# Patient Record
Sex: Female | Born: 1995 | Race: White | Hispanic: No | Marital: Single | Smoking: Former smoker
Health system: Southern US, Community
[De-identification: ages and names within clinical notes are randomized; demographics above are authoritative.]

## PROBLEM LIST (undated history)

## (undated) ENCOUNTER — Inpatient Hospital Stay (HOSPITAL_COMMUNITY): Payer: Self-pay

## (undated) DIAGNOSIS — O321XX1 Maternal care for breech presentation, fetus 1: Secondary | ICD-10-CM

## (undated) DIAGNOSIS — Z98891 History of uterine scar from previous surgery: Secondary | ICD-10-CM

## (undated) DIAGNOSIS — A749 Chlamydial infection, unspecified: Secondary | ICD-10-CM

## (undated) DIAGNOSIS — Z302 Encounter for sterilization: Secondary | ICD-10-CM

## (undated) DIAGNOSIS — Z8619 Personal history of other infectious and parasitic diseases: Secondary | ICD-10-CM

## (undated) DIAGNOSIS — R87629 Unspecified abnormal cytological findings in specimens from vagina: Secondary | ICD-10-CM

## (undated) HISTORY — PX: EYE SURGERY: SHX253

## (undated) HISTORY — PX: OTHER SURGICAL HISTORY: SHX169

---
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
## 2014-03-06 ENCOUNTER — Encounter (HOSPITAL_COMMUNITY): Payer: Self-pay

## 2014-09-24 ENCOUNTER — Encounter (HOSPITAL_COMMUNITY): Payer: Self-pay | Admitting: *Deleted

## 2014-09-24 ENCOUNTER — Emergency Department (HOSPITAL_COMMUNITY)
Admission: EM | Admit: 2014-09-24 | Discharge: 2014-09-24 | Disposition: A | Discharge status: Home / Self Care | Payer: 59 | Attending: Emergency Medicine | Admitting: Emergency Medicine

## 2014-09-24 DIAGNOSIS — Z3202 Encounter for pregnancy test, result negative: Secondary | ICD-10-CM | POA: Insufficient documentation

## 2014-09-24 DIAGNOSIS — Z79899 Other long term (current) drug therapy: Secondary | ICD-10-CM | POA: Diagnosis not present

## 2014-09-24 DIAGNOSIS — Z8619 Personal history of other infectious and parasitic diseases: Secondary | ICD-10-CM | POA: Diagnosis not present

## 2014-09-24 DIAGNOSIS — N730 Acute parametritis and pelvic cellulitis: Secondary | ICD-10-CM | POA: Insufficient documentation

## 2014-09-24 DIAGNOSIS — Z202 Contact with and (suspected) exposure to infections with a predominantly sexual mode of transmission: Secondary | ICD-10-CM | POA: Diagnosis present

## 2014-09-24 LAB — URINALYSIS, ROUTINE W REFLEX MICROSCOPIC
Bilirubin Urine: NEGATIVE
Glucose, UA: NEGATIVE mg/dL
Ketones, ur: NEGATIVE mg/dL
Nitrite: POSITIVE — AB
Protein, ur: NEGATIVE mg/dL
Specific Gravity, Urine: 1.025 (ref 1.005–1.030)
UROBILINOGEN UA: 0.2 mg/dL (ref 0.0–1.0)
pH: 6.5 (ref 5.0–8.0)

## 2014-09-24 LAB — URINE MICROSCOPIC-ADD ON

## 2014-09-24 LAB — PREGNANCY, URINE: Preg Test, Ur: NEGATIVE

## 2014-09-24 MED ORDER — SODIUM CHLORIDE 0.9 % IJ SOLN: 3.0000 mL | Freq: Once | INTRAMUSCULAR | Status: DC

## 2014-09-24 MED ORDER — METRONIDAZOLE 500 MG PO TABS
500.0000 mg | ORAL_TABLET | Freq: Once | ORAL | Status: AC
  Administered 2014-09-24: 500 mg via ORAL
  Filled 2014-09-24: qty 1

## 2014-09-24 MED ORDER — DOXYCYCLINE HYCLATE 100 MG PO TABS
100.0000 mg | ORAL_TABLET | Freq: Once | ORAL | Status: AC
  Administered 2014-09-24: 100 mg via ORAL
  Filled 2014-09-24: qty 1

## 2014-09-24 MED ORDER — CEFTRIAXONE SODIUM 250 MG IJ SOLR
250.0000 mg | Freq: Once | INTRAMUSCULAR | Status: AC
  Filled 2014-09-24: qty 250

## 2014-09-24 MED ORDER — CEFTRIAXONE SODIUM 250 MG IJ SOLR
250.0000 mg | INTRAMUSCULAR | Status: DC
  Administered 2014-09-24: 250 mg via INTRAMUSCULAR
  Filled 2014-09-24: qty 250

## 2014-09-24 MED ORDER — METRONIDAZOLE 500 MG PO TABS: 500.0000 mg | ORAL_TABLET | Freq: Two times a day (BID) | ORAL | Status: DC

## 2014-09-24 MED ORDER — DOXYCYCLINE HYCLATE 100 MG PO CAPS: 100.0000 mg | ORAL_CAPSULE | Freq: Two times a day (BID) | ORAL | Status: DC

## 2014-09-24 MED ORDER — LIDOCAINE HCL (PF) 1 % IJ SOLN
INTRAMUSCULAR | Status: AC
Start: 2014-09-24 — End: 2014-09-24
  Administered 2014-09-24: 5 mL
  Filled 2014-09-24: qty 5

## 2014-09-24 NOTE — Discharge Instructions (Signed)
Pelvic Inflammatory Disease °Pelvic inflammatory disease (PID) refers to an infection in some or all of the female organs. The infection can be in the uterus, ovaries, fallopian tubes, or the surrounding tissues in the pelvis. PID can cause abdominal or pelvic pain that comes on suddenly (acute pelvic pain). PID is a serious infection because it can lead to lasting (chronic) pelvic pain or the inability to have children (infertile).  °CAUSES  °The infection is often caused by the normal bacteria found in the vaginal tissues. PID may also be caused by an infection that is spread during sexual contact. PID can also occur following:  °· The birth of a baby.   °· A miscarriage.   °· An abortion.   °· Major pelvic surgery.   °· The use of an intrauterine device (IUD).   °· A sexual assault.   °RISK FACTORS °Certain factors can put a person at higher risk for PID, such as: °· Being younger than 25 years. °· Being sexually active at a young age. °· Using nonbarrier contraception. °· Having multiple sexual partners. °· Having sex with someone who has symptoms of a genital infection. °· Using oral contraception. °Other times, certain behaviors can increase the possibility of getting PID, such as: °· Having sex during your period. °· Using a vaginal douche. °· Having an intrauterine device (IUD) in place. °SYMPTOMS  °· Abdominal or pelvic pain.   °· Fever.   °· Chills.   °· Abnormal vaginal discharge. °· Abnormal uterine bleeding.   °· Unusual pain shortly after finishing your period. °DIAGNOSIS  °Your caregiver will choose some of the following methods to make a diagnosis, such as:  °· Performing a physical exam and history. A pelvic exam typically reveals a very tender uterus and surrounding pelvis.   °· Ordering laboratory tests including a pregnancy test, blood tests, and urine test.  °· Ordering cultures of the vagina and cervix to check for a sexually transmitted infection (STI). °· Performing an ultrasound.    °· Performing a laparoscopic procedure to look inside the pelvis.   °TREATMENT  °· Antibiotic medicines may be prescribed and taken by mouth.   °· Sexual partners may be treated when the infection is caused by a sexually transmitted disease (STD).   °· Hospitalization may be needed to give antibiotics intravenously. °· Surgery may be needed, but this is rare. °It may take weeks until you are completely well. If you are diagnosed with PID, you should also be checked for human immunodeficiency virus (HIV).   °HOME CARE INSTRUCTIONS  °· If given, take your antibiotics as directed. Finish the medicine even if you start to feel better.   °· Only take over-the-counter or prescription medicines for pain, discomfort, or fever as directed by your caregiver.   °· Do not have sexual intercourse until treatment is completed or as directed by your caregiver. If PID is confirmed, your recent sexual partner(s) will need treatment.   °· Keep your follow-up appointments. °SEEK MEDICAL CARE IF:  °· You have increased or abnormal vaginal discharge.   °· You need prescription medicine for your pain.   °· You vomit.   °· You cannot take your medicines.   °· Your partner has an STD.   °SEEK IMMEDIATE MEDICAL CARE IF:  °· You have a fever.   °· You have increased abdominal or pelvic pain.   °· You have chills.   °· You have pain when you urinate.   °· You are not better after 72 hours following treatment.   °MAKE SURE YOU:  °· Understand these instructions. °· Will watch your condition. °· Will get help right away if you are not doing well or get worse. °  Document Released: 04/21/2005 Document Revised: 08/16/2012 Document Reviewed: 04/17/2011 [facility] Patient Information 2015 [place], [place]. This information is not intended to replace advice given to you by your health care provider. Make sure you discuss any questions you have with your health care provider.  Sexually Transmitted Disease A sexually transmitted disease (STD) is a  disease or infection that may be passed (transmitted) from person to person, usually during sexual activity. This may happen by way of saliva, semen, blood, vaginal mucus, or urine. Common STDs include:   Gonorrhea.   Chlamydia.   Syphilis.   HIV and AIDS.   Genital herpes.   Hepatitis B and C.   Trichomonas.   Human papillomavirus (HPV).   Pubic lice.   Scabies.  Mites.  Bacterial vaginosis. WHAT ARE CAUSES OF STDs? An STD may be caused by bacteria, a virus, or parasites. STDs are often transmitted during sexual activity if one person is infected. However, they may also be transmitted through nonsexual means. STDs may be transmitted after:   Sexual intercourse with an infected person.   Sharing sex toys with an infected person.   Sharing needles with an infected person or using unclean piercing or tattoo needles.  Having intimate contact with the genitals, mouth, or rectal areas of an infected person.   Exposure to infected fluids during birth. WHAT ARE THE SIGNS AND SYMPTOMS OF STDs? Different STDs have different symptoms. Some people may not have any symptoms. If symptoms are present, they may include:   Painful or bloody urination.   Pain in the pelvis, abdomen, vagina, anus, throat, or eyes.   A skin rash, itching, or irritation.  Growths, ulcerations, blisters, or sores in the genital and anal areas.  Abnormal vaginal discharge with or without bad odor.   Penile discharge in men.   Fever.   Pain or bleeding during sexual intercourse.   Swollen glands in the groin area.   Yellow skin and eyes (jaundice). This is seen with hepatitis.   Swollen testicles.  Infertility.  Sores and blisters in the mouth. HOW ARE STDs DIAGNOSED? To make a diagnosis, your health care provider may:   Take a medical history.   Perform a physical exam.   Take a sample of any discharge to examine.  Swab the throat, cervix, opening to the  penis, rectum, or vagina for testing.  Test a sample of your first morning urine.   Perform blood tests.   Perform a Pap test, if this applies.   Perform a colposcopy.   Perform a laparoscopy.  HOW ARE STDs TREATED? Treatment depends on the STD. Some STDs may be treated but not cured.   Chlamydia, gonorrhea, trichomonas, and syphilis can be cured with antibiotic medicine.   Genital herpes, hepatitis, and HIV can be treated, but not cured, with prescribed medicines. The medicines lessen symptoms.   Genital warts from HPV can be treated with medicine or by freezing, burning (electrocautery), or surgery. Warts may come back.   HPV cannot be cured with medicine or surgery. However, abnormal areas may be removed from the cervix, vagina, or vulva.   If your diagnosis is confirmed, your recent sexual partners need treatment. This is true even if they are symptom-free or have a negative culture or evaluation. They should not have sex until their health care providers say it is okay. HOW CAN I REDUCE MY RISK OF GETTING AN STD? Take these steps to reduce your risk of getting an STD:  Use latex condoms, dental dams,  and water-soluble lubricants during sexual activity. Do not use petroleum jelly or oils.  Avoid having multiple sex partners.  Do not have sex with someone who has other sex partners.  Do not have sex with anyone you do not know or who is at high risk for an STD.  Avoid risky sex practices that can break your skin.  Do not have sex if you have open sores on your mouth or skin.  Avoid drinking too much alcohol or taking illegal drugs. Alcohol and drugs can affect your judgment and put you in a vulnerable position.  Avoid engaging in oral and anal sex acts.  Get vaccinated for HPV and hepatitis. If you have not received these vaccines in the past, talk to your health care provider about whether one or both might be right for you.   If you are at risk of being  infected with HIV, it is recommended that you take a prescription medicine daily to prevent HIV infection. This is called pre-exposure prophylaxis (PrEP). You are considered at risk if:  You are a man who has sex with other men (MSM).  You are a heterosexual man or woman and are sexually active with more than one partner.  You take drugs by injection.  You are sexually active with a partner who has HIV.  Talk with your health care provider about whether you are at high risk of being infected with HIV. If you choose to begin PrEP, you should first be tested for HIV. You should then be tested every 3 months for as long as you are taking PrEP.  WHAT SHOULD I DO IF I THINK I HAVE AN STD?  See your health care provider.   Tell your sexual partner(s). They should be tested and treated for any STDs.  Do not have sex until your health care provider says it is okay. WHEN SHOULD I GET IMMEDIATE MEDICAL CARE? Contact your health care provider right away if:   You have severe abdominal pain.  You are a man and notice swelling or pain in your testicles.  You are a woman and notice swelling or pain in your vagina. Document Released: 07/12/2002 Document Revised: 04/26/2013 Document Reviewed: 11/09/2012 [facility] Patient Information 2015 [place]ExitCare, [place]LL[place]. This information is not intended to replace advice given to you by your health care provider. Make sure you discuss any questions you have with your health care provider.

## 2014-09-24 NOTE — ED Notes (Signed)
Pt reports exposure to STD. Reports whitish discharge.
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
## 2014-09-24 NOTE — ED Notes (Signed)
Pelvic supplies at bedside. 

## 2014-09-25 LAB — GC/CHLAMYDIA PROBE AMP (~~LOC~~) NOT AT ARMC
Chlamydia: POSITIVE — AB
Neisseria Gonorrhea: NEGATIVE

## 2014-09-25 LAB — RPR: RPR: NONREACTIVE

## 2014-09-25 LAB — HIV ANTIBODY (ROUTINE TESTING W REFLEX): HIV Screen 4th Generation wRfx: NONREACTIVE

## 2014-09-27 ENCOUNTER — Telehealth (HOSPITAL_COMMUNITY): Payer: Self-pay

## 2014-09-27 NOTE — ED Notes (Addendum)
Positive for chlamydia. Treated per protocol. Advised to notify partner(s) and abstain from sexual activity x 10 days. Pt is following up with OB/GYN. DHHS form completed and faxed

## 2015-09-14 ENCOUNTER — Encounter (HOSPITAL_COMMUNITY): Payer: Self-pay | Admitting: *Deleted

## 2015-09-14 ENCOUNTER — Inpatient Hospital Stay (HOSPITAL_COMMUNITY)
Admission: AD | Admit: 2015-09-14 | Discharge: 2015-09-14 | Disposition: A | Discharge status: Home / Self Care | Payer: BLUE CROSS/BLUE SHIELD | Source: Ambulatory Visit | Attending: Obstetrics & Gynecology | Admitting: Obstetrics & Gynecology

## 2015-09-14 ENCOUNTER — Inpatient Hospital Stay (HOSPITAL_COMMUNITY): Payer: BLUE CROSS/BLUE SHIELD

## 2015-09-14 DIAGNOSIS — O3680X0 Pregnancy with inconclusive fetal viability, not applicable or unspecified: Secondary | ICD-10-CM

## 2015-09-14 DIAGNOSIS — Z3A01 Less than 8 weeks gestation of pregnancy: Secondary | ICD-10-CM | POA: Insufficient documentation

## 2015-09-14 DIAGNOSIS — O26899 Other specified pregnancy related conditions, unspecified trimester: Secondary | ICD-10-CM

## 2015-09-14 DIAGNOSIS — B9689 Other specified bacterial agents as the cause of diseases classified elsewhere: Secondary | ICD-10-CM | POA: Insufficient documentation

## 2015-09-14 DIAGNOSIS — R109 Unspecified abdominal pain: Secondary | ICD-10-CM

## 2015-09-14 DIAGNOSIS — O23591 Infection of other part of genital tract in pregnancy, first trimester: Secondary | ICD-10-CM | POA: Insufficient documentation

## 2015-09-14 DIAGNOSIS — N760 Acute vaginitis: Secondary | ICD-10-CM | POA: Diagnosis not present

## 2015-09-14 LAB — POCT PREGNANCY, URINE: PREG TEST UR: POSITIVE — AB

## 2015-09-14 LAB — CBC WITH DIFFERENTIAL/PLATELET
BASOS ABS: 0 10*3/uL (ref 0.0–0.1)
Basophils Relative: 0 %
Eosinophils Absolute: 0.2 10*3/uL (ref 0.0–0.7)
Eosinophils Relative: 2 %
HEMATOCRIT: 33.6 % — AB (ref 36.0–46.0)
HEMOGLOBIN: 11.5 g/dL — AB (ref 12.0–15.0)
Lymphocytes Relative: 37 %
Lymphs Abs: 3.1 10*3/uL (ref 0.7–4.0)
MCH: 28.9 pg (ref 26.0–34.0)
MCHC: 34.2 g/dL (ref 30.0–36.0)
MCV: 84.4 fL (ref 78.0–100.0)
Monocytes Absolute: 0.5 10*3/uL (ref 0.1–1.0)
Monocytes Relative: 6 %
NEUTROS ABS: 4.5 10*3/uL (ref 1.7–7.7)
NEUTROS PCT: 55 %
Platelets: 253 10*3/uL (ref 150–400)
RBC: 3.98 MIL/uL (ref 3.87–5.11)
RDW: 13.3 % (ref 11.5–15.5)
WBC: 8.2 10*3/uL (ref 4.0–10.5)

## 2015-09-14 LAB — URINALYSIS, ROUTINE W REFLEX MICROSCOPIC
Bilirubin Urine: NEGATIVE
GLUCOSE, UA: NEGATIVE mg/dL
Hgb urine dipstick: NEGATIVE
Ketones, ur: NEGATIVE mg/dL
Nitrite: NEGATIVE
Protein, ur: NEGATIVE mg/dL
pH: 6 (ref 5.0–8.0)

## 2015-09-14 LAB — URINE MICROSCOPIC-ADD ON: RBC / HPF: NONE SEEN RBC/hpf (ref 0–5)

## 2015-09-14 LAB — WET PREP, GENITAL
TRICH WET PREP: NONE SEEN
YEAST WET PREP: NONE SEEN

## 2015-09-14 LAB — HCG, QUANTITATIVE, PREGNANCY: hCG, Beta Chain, Quant, S: 3543 m[IU]/mL — ABNORMAL HIGH (ref <5)

## 2015-09-14 MED ORDER — METRONIDAZOLE 500 MG PO TABS: 500.0000 mg | ORAL_TABLET | Freq: Two times a day (BID) | ORAL | Status: DC

## 2015-09-14 NOTE — Discharge Instructions (Signed)

## 2015-09-14 NOTE — MAU Note (Signed)
Found out i was pregnant recently. Mild cramping today. Denies bleeding. Some clear d/c. LMP 07/23/15

## 2015-09-14 NOTE — Progress Notes (Signed)
Vonzella NippleJulie Wenzel PA in to see pt to discuss test results and d/c plan. Written and verbal d/c instructions given and understanding voiced

## 2015-09-14 NOTE — MAU Provider Note (Signed)
History     CSN: [id number]  Arrival date and time: 09/14/15 1931   First Provider Initiated Contact with Patient 09/14/15 1950      Chief Complaint  Patient presents with  . Abdominal Cramping   HPI Ms. Madison Richmond is a 20 y.o. G2P1001 at [redacted]w[redacted]d who presents to MAU today with complaint of mild lower abdominal cramping. The patient states LMP ~ 07/23/15. She also states a history of irregular periods. She has had +HPT x 5 recently. She has not taken anything for pain. She denies vaginal bleeding, UTI symptoms, N/V/D or fever. She does endorse a small amount of thin, white discharge with mild odor and intermittent mild constipation.   OB History    Gravida Para Term Preterm AB TAB SAB Ectopic Multiple Living   2 1 1  0 0 0 0 0 0 1      Past Medical History  Diagnosis Date  . Chlamydia   . SVD (spontaneous vaginal delivery) 12/01/2011    Past Surgical History  Procedure Laterality Date  . Surgery for blocked tear duct      Family History  Problem Relation Age of Onset  . Hypertension Mother     Social History  Substance Use Topics  . Smoking status: Former Smoker    Types: Cigarettes  . Smokeless tobacco: Never Used  . Alcohol Use: No    Allergies: No Known Allergies  Prescriptions prior to admission  Medication Sig Dispense Refill Last Dose  . doxycycline (VIBRAMYCIN) 100 MG capsule Take 1 capsule (100 mg total) by mouth 2 (two) times daily. (Patient not taking: Reported on 09/14/2015) 28 capsule 0   . ibuprofen (ADVIL,MOTRIN) 200 MG tablet Take 800 mg by mouth every 8 (eight) hours as needed for pain or headache. Reported on 09/14/2015   Not Taking at Unknown time  . metroNIDAZOLE (FLAGYL) 500 MG tablet Take 1 tablet (500 mg total) by mouth 2 (two) times daily. (Patient not taking: Reported on 09/14/2015) 28 tablet 0     Review of Systems  Constitutional: Negative for fever and malaise/fatigue.  Gastrointestinal: Positive for abdominal pain and constipation.  Negative for nausea, vomiting and diarrhea.  Genitourinary: Negative for dysuria, urgency and frequency.       Neg - vaginal bleeding + vaginal discharge   Physical Exam   Blood pressure 118/68, pulse 93, temperature 98.3 F (36.8 C), resp. rate 18, height 5\' 2"  (1.575 m), weight 122 lb (55.339 kg), last menstrual period 07/23/2015.  Physical Exam  Nursing note and vitals reviewed. Constitutional: She is oriented to person, place, and time. She appears well-developed and well-nourished. No distress.  HENT:  Head: Normocephalic and atraumatic.  Cardiovascular: Normal rate.   Respiratory: Effort normal.  GI: Soft. She exhibits no distension and no mass. There is no tenderness. There is no rebound and no guarding.  Genitourinary: Uterus is not enlarged and not tender. Cervix exhibits no motion tenderness, no discharge and no friability. Right adnexum displays no mass and no tenderness. Left adnexum displays no mass and no tenderness. No bleeding in the vagina. Vaginal discharge (small amount of thin, white discharge) found.  Neurological: She is alert and oriented to person, place, and time.  Skin: Skin is warm and dry. No erythema.  Psychiatric: She has a normal mood and affect.    Results for orders placed or performed during the hospital encounter of 09/14/15 (from the past 24 hour(s))  Urinalysis, Routine w reflex microscopic (not at [facility])  Status: Abnormal   Collection Time: 09/14/15  7:43 PM  Result Value Ref Range   Color, Urine YELLOW YELLOW   APPearance CLEAR CLEAR   Specific Gravity, Urine <1.005 (L) 1.005 - 1.030   pH 6.0 5.0 - 8.0   Glucose, UA NEGATIVE NEGATIVE mg/dL   Hgb urine dipstick NEGATIVE NEGATIVE   Bilirubin Urine NEGATIVE NEGATIVE   Ketones, ur NEGATIVE NEGATIVE mg/dL   Protein, ur NEGATIVE NEGATIVE mg/dL   Nitrite NEGATIVE NEGATIVE   Leukocytes, UA TRACE (A) NEGATIVE  Urine microscopic-add on     Status: Abnormal   Collection Time: 09/14/15  7:43 PM   Result Value Ref Range   Squamous Epithelial / LPF 0-5 (A) NONE SEEN   WBC, UA 0-5 0 - 5 WBC/hpf   RBC / HPF NONE SEEN 0 - 5 RBC/hpf   Bacteria, UA FEW (A) NONE SEEN  Pregnancy, urine POC     Status: Abnormal   Collection Time: 09/14/15  7:52 PM  Result Value Ref Range   Preg Test, Ur POSITIVE (A) NEGATIVE  Wet prep, genital     Status: Abnormal   Collection Time: 09/14/15  8:00 PM  Result Value Ref Range   Yeast Wet Prep HPF POC NONE SEEN NONE SEEN   Trich, Wet Prep NONE SEEN NONE SEEN   Clue Cells Wet Prep HPF POC PRESENT (A) NONE SEEN   WBC, Wet Prep HPF POC MANY (A) NONE SEEN   Sperm PRESENT   CBC with Differential/Platelet     Status: Abnormal   Collection Time: 09/14/15  8:04 PM  Result Value Ref Range   WBC 8.2 4.0 - 10.5 K/uL   RBC 3.98 3.87 - 5.11 MIL/uL   Hemoglobin 11.5 (L) 12.0 - 15.0 g/dL   HCT 16.133.6 (L) 09.636.0 - 04.546.0 %   MCV 84.4 78.0 - 100.0 fL   MCH 28.9 26.0 - 34.0 pg   MCHC 34.2 30.0 - 36.0 g/dL   RDW 40.913.3 81.111.5 - 91.415.5 %   Platelets 253 150 - 400 K/uL   Neutrophils Relative % 55 %   Neutro Abs 4.5 1.7 - 7.7 K/uL   Lymphocytes Relative 37 %   Lymphs Abs 3.1 0.7 - 4.0 K/uL   Monocytes Relative 6 %   Monocytes Absolute 0.5 0.1 - 1.0 K/uL   Eosinophils Relative 2 %   Eosinophils Absolute 0.2 0.0 - 0.7 K/uL   Basophils Relative 0 %   Basophils Absolute 0.0 0.0 - 0.1 K/uL   [place] Ob Comp Less 14 Wks  09/14/2015  CLINICAL DATA:  Mild pelvic pain started today. LMP is03/28/2017. Gestational age by LMP is7 weeks 4 days. EDC by LMP is12/25/2017. EXAM: OBSTETRIC <14 WK ULTRASOUND TECHNIQUE: Transabdominal ultrasound was performed for evaluation of the gestation as well as the maternal uterus and adnexal regions. COMPARISON:  None applicable FINDINGS: Intrauterine gestational sac: Present Yolk sac:  Not seen Embryo:  Not seen Cardiac Activity: Not seen MSD: 5.3  mm   5 w   2  d Subchorionic hemorrhage:  None visualized. Maternal uterus/adnexae: Right corpus luteum is  present. Left ovary has a normal appearance. Trace free pelvic fluid. IMPRESSION: 1. Intrauterine gestational sac is present. Embryo is not yet visualized. 2. Follow-up ultrasound is recommended in 14 or more days to document presence of fetal pole and for dating purposes. Electronically Signed   By: Norva PavlovElizabeth  Brown M.D.   On: 09/14/2015 20:45   [place] Ob Transvaginal  09/14/2015  CLINICAL DATA:  Mild pelvic  pain started today. LMP is03/28/2017. Gestational age by LMP is7 weeks 4 days. EDC by LMP is12/25/2017. EXAM: OBSTETRIC <14 WK ULTRASOUND TECHNIQUE: Transabdominal ultrasound was performed for evaluation of the gestation as well as the maternal uterus and adnexal regions. COMPARISON:  None applicable FINDINGS: Intrauterine gestational sac: Present Yolk sac:  Not seen Embryo:  Not seen Cardiac Activity: Not seen MSD: 5.3  mm   5 w   2  d Subchorionic hemorrhage:  None visualized. Maternal uterus/adnexae: Right corpus luteum is present. Left ovary has a normal appearance. Trace free pelvic fluid. IMPRESSION: 1. Intrauterine gestational sac is present. Embryo is not yet visualized. 2. Follow-up ultrasound is recommended in 14 or more days to document presence of fetal pole and for dating purposes. Electronically Signed   By: Norva Pavlov M.D.   On: 09/14/2015 20:45    MAU Course  Procedures None  MDM +UPT UA, wet prep, GC/chlamydia, CBC, quant hCG, HIV, RPR and [place] today to rule out ectopic pregnancy  Assessment and Plan  A: IUGS at [redacted]w[redacted]d without YS or FP Pregnancy of unknown location Abdominal pain in pregnancy, first trimester Bacterial vaginosis  P: Discharge home Rx for Flagyl given to patient  Ectopic precautions discussed Patient advised to follow-up in MAU on Sunday evening for repeat labs or sooner if her condition were to change or worsen   Marny Lowenstein, PA-C  09/14/2015, 8:48 PM

## 2015-09-15 LAB — HIV ANTIBODY (ROUTINE TESTING W REFLEX): HIV Screen 4th Generation wRfx: NONREACTIVE

## 2015-09-15 LAB — RPR: RPR Ser Ql: NONREACTIVE

## 2015-09-16 ENCOUNTER — Inpatient Hospital Stay (HOSPITAL_COMMUNITY)
Admission: AD | Admit: 2015-09-16 | Discharge: 2015-09-16 | Disposition: A | Discharge status: Home / Self Care | Payer: BLUE CROSS/BLUE SHIELD | Source: Ambulatory Visit | Attending: Obstetrics & Gynecology | Admitting: Obstetrics & Gynecology

## 2015-09-16 DIAGNOSIS — R102 Pelvic and perineal pain: Secondary | ICD-10-CM | POA: Diagnosis not present

## 2015-09-16 DIAGNOSIS — Z87891 Personal history of nicotine dependence: Secondary | ICD-10-CM | POA: Diagnosis not present

## 2015-09-16 DIAGNOSIS — O26891 Other specified pregnancy related conditions, first trimester: Secondary | ICD-10-CM | POA: Insufficient documentation

## 2015-09-16 DIAGNOSIS — O9989 Other specified diseases and conditions complicating pregnancy, childbirth and the puerperium: Secondary | ICD-10-CM | POA: Diagnosis not present

## 2015-09-16 DIAGNOSIS — Z3A01 Less than 8 weeks gestation of pregnancy: Secondary | ICD-10-CM | POA: Insufficient documentation

## 2015-09-16 DIAGNOSIS — O3680X0 Pregnancy with inconclusive fetal viability, not applicable or unspecified: Secondary | ICD-10-CM

## 2015-09-16 LAB — HCG, QUANTITATIVE, PREGNANCY: hCG, Beta Chain, Quant, S: 9179 m[IU]/mL — ABNORMAL HIGH (ref <5)

## 2015-09-16 NOTE — MAU Provider Note (Signed)
  History     CSN: [id number]  Arrival date and time: 09/16/15 1919   First Provider Initiated Contact with Patient 09/16/15 2054      Chief Complaint  Patient presents with  . Follow-up   HPI Comments: Madison Richmond is a 20 y.o. G2P1001 at 721w4d who presents today for FU HCG. She denies any abdominal pain or vaginal bleeding today.  Pelvic Pain The patient's primary symptoms include pelvic pain. This is a new problem. The current episode started in the past 7 days. The problem occurs intermittently. The problem has been resolved. The patient is experiencing no pain. She is pregnant. Pertinent negatives include no abdominal pain, chills, constipation, diarrhea, dysuria, fever, frequency, nausea, urgency or vomiting. There has been no bleeding. Nothing aggravates the symptoms. She has tried nothing for the symptoms.      Past Medical History  Diagnosis Date  . Chlamydia   . SVD (spontaneous vaginal delivery) 12/01/2011    Past Surgical History  Procedure Laterality Date  . Surgery for blocked tear duct      Family History  Problem Relation Age of Onset  . Hypertension Mother     Social History  Substance Use Topics  . Smoking status: Former Smoker    Types: Cigarettes  . Smokeless tobacco: Never Used  . Alcohol Use: No    Allergies: No Known Allergies  Prescriptions prior to admission  Medication Sig Dispense Refill Last Dose  . metroNIDAZOLE (FLAGYL) 500 MG tablet Take 1 tablet (500 mg total) by mouth 2 (two) times daily. 14 tablet 0     Review of Systems  Constitutional: Negative for fever and chills.  Gastrointestinal: Negative for nausea, vomiting, abdominal pain, diarrhea and constipation.  Genitourinary: Positive for pelvic pain. Negative for dysuria, urgency and frequency.   Physical Exam   Blood pressure 117/62, pulse 104, temperature 98.3 F (36.8 C), temperature source Oral, resp. rate 16, height 5\' 2"  (1.575 m), weight 54.885 kg (121 lb), last  menstrual period 07/23/2015, SpO2 100 %.  Physical Exam  Nursing note and vitals reviewed. Constitutional: She is oriented to person, place, and time. She appears well-developed and well-nourished. No distress.  HENT:  Head: Normocephalic.  Cardiovascular: Normal rate.   Respiratory: Effort normal.  GI: Soft. There is no tenderness. There is no rebound.  Musculoskeletal: Normal range of motion.  Neurological: She is alert and oriented to person, place, and time.  Skin: Skin is warm and dry.  Psychiatric: She has a normal mood and affect.   Results for Madison LighterCRAWFORD, Madison D (MRN [id number]) as of 09/16/2015 20:53  Ref. Range 09/14/2015 20:04 09/14/2015 20:34 09/16/2015 19:32  HCG, Beta Chain, Quant, S Latest Ref Range: <5 mIU/mL 3543 (H)  9179 (H)   MAU Course  Procedures  MDM   Assessment and Plan   1. Pregnancy, location unknown    DC home Comfort measures reviewed  1st Trimester precautions  Bleeding precautions Ectopic precautions RX: none  Return to MAU as needed   Follow-up Information    [facility]   Specialty:  Radiology   Why:  They will call you with an appointment   Contact information:   [address] [id number] mc [place] [phone]        Tawnya CrookHogan, Keylon Labelle Donovan 09/16/2015, 8:54 PM

## 2015-09-16 NOTE — MAU Note (Signed)
Pt here for repeat HCG level. Denies vaginal bleeding or pain. No other complaints.

## 2015-09-16 NOTE — Discharge Instructions (Signed)

## 2015-09-17 LAB — GC/CHLAMYDIA PROBE AMP (~~LOC~~) NOT AT ARMC
Chlamydia: NEGATIVE
Neisseria Gonorrhea: NEGATIVE

## 2015-09-25 ENCOUNTER — Ambulatory Visit (HOSPITAL_COMMUNITY): Payer: BLUE CROSS/BLUE SHIELD

## 2015-10-10 LAB — OB RESULTS CONSOLE ANTIBODY SCREEN: Antibody Screen: NEGATIVE

## 2015-10-10 LAB — OB RESULTS CONSOLE RPR: RPR: NONREACTIVE

## 2015-10-10 LAB — OB RESULTS CONSOLE GC/CHLAMYDIA
Chlamydia: NEGATIVE
Gonorrhea: NEGATIVE

## 2015-10-10 LAB — OB RESULTS CONSOLE HEPATITIS B SURFACE ANTIGEN: HEP B S AG: NEGATIVE

## 2015-10-10 LAB — OB RESULTS CONSOLE HIV ANTIBODY (ROUTINE TESTING): HIV: NONREACTIVE

## 2015-10-10 LAB — OB RESULTS CONSOLE ABO/RH: RH TYPE: POSITIVE

## 2015-10-10 LAB — OB RESULTS CONSOLE RUBELLA ANTIBODY, IGM: Rubella: IMMUNE

## 2016-04-10 ENCOUNTER — Encounter (HOSPITAL_COMMUNITY): Payer: Self-pay

## 2016-04-10 ENCOUNTER — Inpatient Hospital Stay (HOSPITAL_COMMUNITY)
Admission: AD | Admit: 2016-04-10 | Discharge: 2016-04-10 | Disposition: A | Discharge status: Home / Self Care | Payer: BLUE CROSS/BLUE SHIELD | Source: Ambulatory Visit | Attending: Obstetrics and Gynecology | Admitting: Obstetrics and Gynecology

## 2016-04-10 DIAGNOSIS — N949 Unspecified condition associated with female genital organs and menstrual cycle: Secondary | ICD-10-CM

## 2016-04-10 DIAGNOSIS — O9989 Other specified diseases and conditions complicating pregnancy, childbirth and the puerperium: Secondary | ICD-10-CM

## 2016-04-10 DIAGNOSIS — Z87891 Personal history of nicotine dependence: Secondary | ICD-10-CM | POA: Diagnosis not present

## 2016-04-10 DIAGNOSIS — O26893 Other specified pregnancy related conditions, third trimester: Secondary | ICD-10-CM | POA: Diagnosis not present

## 2016-04-10 DIAGNOSIS — R102 Pelvic and perineal pain: Secondary | ICD-10-CM | POA: Diagnosis present

## 2016-04-10 DIAGNOSIS — Z3A34 34 weeks gestation of pregnancy: Secondary | ICD-10-CM

## 2016-04-10 LAB — URINALYSIS, ROUTINE W REFLEX MICROSCOPIC
Bilirubin Urine: NEGATIVE
GLUCOSE, UA: NEGATIVE mg/dL
Ketones, ur: NEGATIVE mg/dL
LEUKOCYTES UA: NEGATIVE
Nitrite: NEGATIVE
PH: 7 (ref 5.0–8.0)
Protein, ur: NEGATIVE mg/dL
SPECIFIC GRAVITY, URINE: 1.015 (ref 1.005–1.030)

## 2016-04-10 LAB — URINALYSIS, MICROSCOPIC (REFLEX)

## 2016-04-10 NOTE — MAU Provider Note (Signed)
  History     CSN: [id number]  Arrival date and time: 04/10/16 2128   First Provider Initiated Contact with Patient 04/10/16 2204      Chief Complaint  Patient presents with  . Pelvic Pain   Madison Richmond is a 20 y.o. G2P1001 at 2166w2d who presents today with pelvic pain. She states that around dinner time it was "very bad", and on the ride over here she felt the baby move, then the pain stopped. She denies any VB or LOF. She confirms normal fetal movement.    Pelvic Pain  The patient's primary symptoms include pelvic pain. This is a new problem. The current episode started today. The problem occurs intermittently. The problem has been resolved. She is pregnant. Pertinent negatives include no abdominal pain, chills, constipation, diarrhea, dysuria, fever, frequency, nausea, urgency or vomiting. The vaginal discharge was normal. There has been no bleeding. Nothing aggravates the symptoms. Treatments tried: felt baby move into a new position, and now the pain is gone.  The treatment provided significant relief.   Past Medical History:  Diagnosis Date  . Chlamydia   . SVD (spontaneous vaginal delivery) 12/01/2011    Past Surgical History:  Procedure Laterality Date  . Surgery for blocked tear duct      Family History  Problem Relation Age of Onset  . Hypertension Mother     Social History  Substance Use Topics  . Smoking status: Former Smoker    Types: Cigarettes  . Smokeless tobacco: Never Used  . Alcohol use No    Allergies: No Known Allergies  Prescriptions Prior to Admission  Medication Sig Dispense Refill Last Dose  . metroNIDAZOLE (FLAGYL) 500 MG tablet Take 1 tablet (500 mg total) by mouth 2 (two) times daily. 14 tablet 0     Review of Systems  Constitutional: Negative for chills and fever.  Gastrointestinal: Negative for abdominal pain, constipation, diarrhea, nausea and vomiting.  Genitourinary: Positive for pelvic pain. Negative for dysuria, frequency and  urgency.   Physical Exam   Blood pressure 120/68, pulse 87, temperature 98.3 F (36.8 C), temperature source Oral, resp. rate 17, height 5\' 2"  (1.575 m), weight 176 lb (79.8 kg), last menstrual period 07/23/2015, SpO2 100 %.  Physical Exam  Nursing note and vitals reviewed. Constitutional: She is oriented to person, place, and time. She appears well-developed and well-nourished. No distress.  HENT:  Head: Normocephalic.  Cardiovascular: Normal rate.   Respiratory: Effort normal.  GI: Soft. There is no tenderness. There is no rebound.  Genitourinary:  Genitourinary Comments: Cervix: closed/thick   Neurological: She is alert and oriented to person, place, and time.  Skin: Skin is warm and dry.  Psychiatric: She has a normal mood and affect.  FHT: 140, moderate with 15x15 accels, no decels Toco: no UCs   MAU Course  Procedures  MDM 2212: D/W Dr. Ellyn HackBovard, ok for DC home.   Assessment and Plan   1. Round ligament pain   2. [redacted] weeks gestation of pregnancy    DC home Comfort measures reviewed  3rd Trimester precautions  PTL precautions  Fetal kick counts RX: none  Return to MAU as needed FU with OB as planned  Follow-up Information    Bovard-Stuckert, Jody, MD Follow up.   Specialty:  Obstetrics and Gynecology Contact information: [address] [place] [phone]            Madison Richmond, Madison Richmond 04/10/2016, 10:05 PM

## 2016-04-10 NOTE — Discharge Instructions (Signed)

## 2016-04-10 NOTE — MAU Note (Signed)
Pt reports cramping that started this afternoon and tonight she began having pain in her hips and lower back. Denies bleeding.

## 2016-05-01 ENCOUNTER — Encounter (HOSPITAL_COMMUNITY): Payer: Self-pay

## 2016-05-02 ENCOUNTER — Telehealth (HOSPITAL_COMMUNITY): Payer: Self-pay | Admitting: *Deleted

## 2016-05-02 NOTE — Telephone Encounter (Signed)
Preadmission screen  

## 2016-05-07 ENCOUNTER — Telehealth (HOSPITAL_COMMUNITY): Payer: Self-pay | Admitting: *Deleted

## 2016-05-07 NOTE — Telephone Encounter (Signed)
Preadmission screen  

## 2016-05-08 ENCOUNTER — Encounter (HOSPITAL_COMMUNITY): Payer: Self-pay

## 2016-05-12 ENCOUNTER — Encounter (HOSPITAL_COMMUNITY)
Admission: RE | Admit: 2016-05-12 | Discharge: 2016-05-12 | Disposition: A | Discharge status: Home / Self Care | Payer: Medicaid Other | Source: Ambulatory Visit | Attending: Obstetrics and Gynecology | Admitting: Obstetrics and Gynecology

## 2016-05-12 ENCOUNTER — Encounter (HOSPITAL_COMMUNITY): Payer: Self-pay | Admitting: Obstetrics and Gynecology

## 2016-05-12 DIAGNOSIS — O321XX1 Maternal care for breech presentation, fetus 1: Secondary | ICD-10-CM

## 2016-05-12 HISTORY — DX: Maternal care for breech presentation, fetus 1: O32.1XX1

## 2016-05-12 HISTORY — DX: Personal history of other infectious and parasitic diseases: Z86.19

## 2016-05-12 HISTORY — DX: Unspecified abnormal cytological findings in specimens from vagina: R87.629

## 2016-05-12 LAB — CBC
HCT: 35.5 % — ABNORMAL LOW (ref 36.0–46.0)
Hemoglobin: 12.1 g/dL (ref 12.0–15.0)
MCH: 28.7 pg (ref 26.0–34.0)
MCHC: 34.1 g/dL (ref 30.0–36.0)
MCV: 84.1 fL (ref 78.0–100.0)
Platelets: 331 10*3/uL (ref 150–400)
RBC: 4.22 MIL/uL (ref 3.87–5.11)
RDW: 14.8 % (ref 11.5–15.5)
WBC: 12.2 10*3/uL — ABNORMAL HIGH (ref 4.0–10.5)

## 2016-05-12 NOTE — Patient Instructions (Signed)
18 Madison Richmond  05/12/2016   Your procedure is scheduled on:  03/13/2017  [facility] through [facility]tr[facility] at 0730 AM.  Pick up the phone at the desk and dial [phone].   Call this number if you have problems the morning of surgery: [phone]   Remember:   Do not eat food:After Midnight.  Do not drink clear liquids: After Midnight.  Take these medicines the morning of surgery with A SIP OF WATER: none   Do not wear jewelry, make-up or nail polish.  Do not wear lotions, powders, or perfumes. Do not wear deodorant.  Do not shave 48 hours prior to surgery.  Do not bring valuables to the hospital.  [facility] is not   responsible for any belongings or valuables brought to the hospital.  Contacts, dentures or bridgework may not be worn into surgery.  Leave suitcase in the car. After surgery it may be brought to your room.  For patients admitted to the hospital, checkout time is 11:00 AM the day of              discharge.   Patients discharged the day of surgery will not be allowed to drive             home.  Name and phone number of your driver:na  Special Instructions:   N/A   Please read over the following fact sheets that you were given:   Surgical Site Infection Prevention

## 2016-05-12 NOTE — H&P (Addendum)
Kamdyn D Okey DupreCrawford is a [age] y.o. female G2P1001 at [facility]39week with breech presentation, declines version.  D/W pt r/b/a of LTCS also process.  Relatively uncomplicated prenatal care [facility] [facility].  Tdap and Flu 02/18/17.    OB History    Gravida Para Term Preterm AB Living   2 1 1  0 0 1   SAB TAB Ectopic Multiple Live Births   0 0 0 0 1    G1 female  SVD 7#6, 11/2011 G2 present  [facility] [place] [facility] [place] - GC/Chl  Past Medical History:  Diagnosis Date  . Breech presentation on examination, fetus 1 05/12/2016  . Chlamydia   . History of gonorrhea   . SVD (spontaneous vaginal delivery) 12/01/2011  . Vaginal Pap smear, abnormal    Past Surgical History:  Procedure Laterality Date  . EYE SURGERY     blocked L tear duct  . Surgery for blocked tear duct     Family History: family history includes Hypertension in her mother.MI, fibroids, bipolar, RA, COPD, lung CA  Social History:  reports that she has quit smoking. Her smoking use included Cigarettes. She has never used smokeless tobacco. She reports that she does not drink alcohol or use drugs. 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   [facility] PNV All NKDA     Maternal Diabetes: No Genetic Screening: Normal Maternal Ultrasounds/Referrals: Normal Fetal Ultrasounds or other Referrals:  None Maternal Substance Abuse:  No Significant Maternal Medications:  None Significant Maternal Lab Results:  Lab values include: Group B Strep negative Other Comments:  None  Review of Systems  Constitutional: Negative.   HENT: Negative.   Eyes: Negative.   Respiratory: Negative.   Cardiovascular: Negative.   Gastrointestinal: Negative.   Genitourinary: Negative.   Musculoskeletal: Positive for back pain.  Skin: Negative.   Neurological: Negative.   Psychiatric/Behavioral: Negative.    Maternal Medical History:  Contractions: Frequency: irregular.    Fetal activity: Perceived fetal activity is normal.    Prenatal Complications -  Diabetes: none.      Last menstrual period 07/23/2015. Maternal Exam:  Uterine Assessment: Contraction frequency is irregular.   Abdomen: Patient reports no abdominal tenderness. Fundal height is appropriate for gestation.   Estimated fetal weight is 7.5-8#.   Fetal presentation: vertex  Introitus: Normal vulva. Normal vagina.  Cervix: Cervix evaluated by digital exam.     Physical Exam  Constitutional: She is oriented to person, place, and time. She appears well-developed and well-nourished.  HENT:  Head: Normocephalic and atraumatic.  Cardiovascular: Normal rate and regular rhythm.   Respiratory: Effort normal and breath sounds normal. No respiratory distress. She has no wheezes.  GI: Soft. Bowel sounds are normal. She exhibits no distension. There is no tenderness.  Musculoskeletal: Normal range of motion.  Neurological: She is alert and oriented to person, place, and time.  Skin: Skin is warm and dry.  Psychiatric: She has a normal mood and affect. Her behavior is normal.    Prenatal labs: ABO, Rh: --/--/A NEG (01/08 1010) Antibody: NEG (01/08 1010) Rubella: Immune (06/07 0000) RPR: Nonreactive (06/07 0000)  HBsAg: Negative (06/07 0000)  HIV: Non-reactive (06/07 0000)  GBS:   negative  Hgb 11.6/Plt 254/Ur Cx neg/ Chl neg/GC neg/Pap LGSIL - colpo/First Trimester WNL/AFP WNL/glucola 132  US nl NT US nl anat, ant plac, female  Assessment/Plan 20yoG2P1001 at 5139 with breech presentation for LTCS D/w pt r/b/a of surgery Ancef for prophylaxis  Circ  in office - paid    Bovard-Stuckert, Nazaire Cordial 05/12/2016, 1:22 PM

## 2016-05-13 ENCOUNTER — Inpatient Hospital Stay (HOSPITAL_COMMUNITY): Payer: Medicaid Other | Admitting: Anesthesiology

## 2016-05-13 ENCOUNTER — Encounter (HOSPITAL_COMMUNITY)
Admission: RE | Disposition: A | Discharge status: Home / Self Care | Payer: Self-pay | Source: Ambulatory Visit | Attending: Obstetrics and Gynecology

## 2016-05-13 ENCOUNTER — Encounter (HOSPITAL_COMMUNITY): Payer: Self-pay | Admitting: *Deleted

## 2016-05-13 ENCOUNTER — Inpatient Hospital Stay (HOSPITAL_COMMUNITY)
Admission: RE | Admit: 2016-05-13 | Discharge: 2016-05-15 | DRG: 766 | Disposition: A | Discharge status: Home / Self Care | Payer: Medicaid Other | Source: Ambulatory Visit | Attending: Obstetrics and Gynecology | Admitting: Obstetrics and Gynecology

## 2016-05-13 DIAGNOSIS — O321XX0 Maternal care for breech presentation, not applicable or unspecified: Secondary | ICD-10-CM | POA: Diagnosis present

## 2016-05-13 DIAGNOSIS — O321XX1 Maternal care for breech presentation, fetus 1: Secondary | ICD-10-CM | POA: Diagnosis present

## 2016-05-13 DIAGNOSIS — Z8249 Family history of ischemic heart disease and other diseases of the circulatory system: Secondary | ICD-10-CM

## 2016-05-13 DIAGNOSIS — Z3A39 39 weeks gestation of pregnancy: Secondary | ICD-10-CM

## 2016-05-13 DIAGNOSIS — Z87891 Personal history of nicotine dependence: Secondary | ICD-10-CM

## 2016-05-13 DIAGNOSIS — Z98891 History of uterine scar from previous surgery: Secondary | ICD-10-CM

## 2016-05-13 HISTORY — DX: History of uterine scar from previous surgery: Z98.891

## 2016-05-13 HISTORY — DX: Maternal care for breech presentation, fetus 1: O32.1XX1

## 2016-05-13 LAB — RPR: RPR Ser Ql: NONREACTIVE

## 2016-05-13 SURGERY — Surgical Case
Anesthesia: Spinal | Wound class: Clean Contaminated

## 2016-05-13 MED ORDER — NALBUPHINE HCL 10 MG/ML IJ SOLN
5.0000 mg | Freq: Once | INTRAMUSCULAR | Status: AC | PRN
  Administered 2016-05-13: 5 mg via SUBCUTANEOUS

## 2016-05-13 MED ORDER — CEFAZOLIN SODIUM-DEXTROSE 2-4 GM/100ML-% IV SOLN
2.0000 g | INTRAVENOUS | Status: AC
  Administered 2016-05-13: 2 g via INTRAVENOUS
  Filled 2016-05-13: qty 100

## 2016-05-13 MED ORDER — DIPHENHYDRAMINE HCL 50 MG/ML IJ SOLN: 12.5000 mg | INTRAMUSCULAR | Status: DC | PRN

## 2016-05-13 MED ORDER — ACETAMINOPHEN 325 MG PO TABS
650.0000 mg | ORAL_TABLET | ORAL | Status: DC | PRN
  Administered 2016-05-13 – 2016-05-14 (×2): 650 mg via ORAL
  Filled 2016-05-13 (×2): qty 2

## 2016-05-13 MED ORDER — MENTHOL 3 MG MT LOZG: 1.0000 | LOZENGE | OROMUCOSAL | Status: DC | PRN

## 2016-05-13 MED ORDER — PHENYLEPHRINE 8 MG IN D5W 100 ML (0.08MG/ML) PREMIX OPTIME
INJECTION | INTRAVENOUS | Status: DC | PRN
  Administered 2016-05-13: 60 ug/min via INTRAVENOUS

## 2016-05-13 MED ORDER — SIMETHICONE 80 MG PO CHEW
80.0000 mg | CHEWABLE_TABLET | ORAL | Status: DC | PRN
  Filled 2016-05-13: qty 1

## 2016-05-13 MED ORDER — SCOPOLAMINE 1 MG/3DAYS TD PT72: 1.0000 | MEDICATED_PATCH | Freq: Once | TRANSDERMAL | Status: DC

## 2016-05-13 MED ORDER — FENTANYL CITRATE (PF) 100 MCG/2ML IJ SOLN
INTRAMUSCULAR | Status: AC
  Filled 2016-05-13: qty 2

## 2016-05-13 MED ORDER — KETOROLAC TROMETHAMINE 30 MG/ML IJ SOLN
INTRAMUSCULAR | Status: AC
  Administered 2016-05-13: 30 mg via INTRAVENOUS
  Filled 2016-05-13: qty 1

## 2016-05-13 MED ORDER — NALOXONE HCL 0.4 MG/ML IJ SOLN: 0.4000 mg | INTRAMUSCULAR | Status: DC | PRN

## 2016-05-13 MED ORDER — ZOLPIDEM TARTRATE 5 MG PO TABS: 5.0000 mg | ORAL_TABLET | Freq: Every evening | ORAL | Status: DC | PRN

## 2016-05-13 MED ORDER — SODIUM CHLORIDE 0.9% FLUSH: 3.0000 mL | INTRAVENOUS | Status: DC | PRN

## 2016-05-13 MED ORDER — OXYCODONE HCL 5 MG PO TABS
5.0000 mg | ORAL_TABLET | ORAL | Status: DC | PRN
  Administered 2016-05-14 (×3): 5 mg via ORAL
  Filled 2016-05-13 (×3): qty 1

## 2016-05-13 MED ORDER — WITCH HAZEL-GLYCERIN EX PADS: 1.0000 "application " | MEDICATED_PAD | CUTANEOUS | Status: DC | PRN

## 2016-05-13 MED ORDER — COCONUT OIL OIL: 1.0000 "application " | TOPICAL_OIL | Status: DC | PRN

## 2016-05-13 MED ORDER — PHENYLEPHRINE 8 MG IN D5W 100 ML (0.08MG/ML) PREMIX OPTIME
INJECTION | INTRAVENOUS | Status: AC
  Filled 2016-05-13: qty 100

## 2016-05-13 MED ORDER — ACETAMINOPHEN 500 MG PO TABS
1000.0000 mg | ORAL_TABLET | Freq: Four times a day (QID) | ORAL | Status: AC
  Administered 2016-05-13 – 2016-05-14 (×3): 1000 mg via ORAL
  Filled 2016-05-13 (×4): qty 2

## 2016-05-13 MED ORDER — LACTATED RINGERS IV SOLN
INTRAVENOUS | Status: DC | PRN
  Administered 2016-05-13: 10:00:00 via INTRAVENOUS

## 2016-05-13 MED ORDER — DIPHENHYDRAMINE HCL 25 MG PO CAPS
25.0000 mg | ORAL_CAPSULE | Freq: Four times a day (QID) | ORAL | Status: DC | PRN
  Filled 2016-05-13: qty 1

## 2016-05-13 MED ORDER — MEPERIDINE HCL 25 MG/ML IJ SOLN: 6.2500 mg | INTRAMUSCULAR | Status: DC | PRN

## 2016-05-13 MED ORDER — NALBUPHINE HCL 10 MG/ML IJ SOLN
5.0000 mg | INTRAMUSCULAR | Status: DC | PRN
  Administered 2016-05-13: 5 mg via SUBCUTANEOUS
  Filled 2016-05-13: qty 1

## 2016-05-13 MED ORDER — DIPHENHYDRAMINE HCL 25 MG PO CAPS
25.0000 mg | ORAL_CAPSULE | ORAL | Status: DC | PRN
  Administered 2016-05-14: 25 mg via ORAL
  Filled 2016-05-13 (×2): qty 1

## 2016-05-13 MED ORDER — ONDANSETRON HCL 4 MG/2ML IJ SOLN: 4.0000 mg | Freq: Three times a day (TID) | INTRAMUSCULAR | Status: DC | PRN

## 2016-05-13 MED ORDER — KETOROLAC TROMETHAMINE 30 MG/ML IJ SOLN: 30.0000 mg | Freq: Once | INTRAMUSCULAR | Status: DC

## 2016-05-13 MED ORDER — PRENATAL MULTIVITAMIN CH
1.0000 | ORAL_TABLET | Freq: Every day | ORAL | Status: DC
  Administered 2016-05-14: 1 via ORAL
  Filled 2016-05-13: qty 1

## 2016-05-13 MED ORDER — FENTANYL CITRATE (PF) 100 MCG/2ML IJ SOLN
INTRAMUSCULAR | Status: DC | PRN
  Administered 2016-05-13: 20 ug via INTRATHECAL

## 2016-05-13 MED ORDER — ONDANSETRON HCL 4 MG/2ML IJ SOLN
INTRAMUSCULAR | Status: AC
  Filled 2016-05-13: qty 2

## 2016-05-13 MED ORDER — DIBUCAINE 1 % RE OINT: 1.0000 "application " | TOPICAL_OINTMENT | RECTAL | Status: DC | PRN

## 2016-05-13 MED ORDER — KETOROLAC TROMETHAMINE 30 MG/ML IJ SOLN
30.0000 mg | Freq: Four times a day (QID) | INTRAMUSCULAR | Status: AC | PRN
  Administered 2016-05-13 – 2016-05-14 (×3): 30 mg via INTRAVENOUS
  Filled 2016-05-13 (×3): qty 1

## 2016-05-13 MED ORDER — NALBUPHINE HCL 10 MG/ML IJ SOLN: 5.0000 mg | INTRAMUSCULAR | Status: DC | PRN

## 2016-05-13 MED ORDER — MORPHINE SULFATE (PF) 0.5 MG/ML IJ SOLN
INTRAMUSCULAR | Status: DC | PRN
  Administered 2016-05-13: .2 mg via INTRATHECAL

## 2016-05-13 MED ORDER — IBUPROFEN 600 MG PO TABS: 600.0000 mg | ORAL_TABLET | Freq: Four times a day (QID) | ORAL | Status: DC | PRN

## 2016-05-13 MED ORDER — LACTATED RINGERS IV SOLN
INTRAVENOUS | Status: DC
  Administered 2016-05-13: 19:00:00 via INTRAVENOUS

## 2016-05-13 MED ORDER — NALBUPHINE HCL 10 MG/ML IJ SOLN
INTRAMUSCULAR | Status: AC
  Administered 2016-05-13: 5 mg
  Filled 2016-05-13: qty 1

## 2016-05-13 MED ORDER — SIMETHICONE 80 MG PO CHEW
80.0000 mg | CHEWABLE_TABLET | ORAL | Status: DC
  Filled 2016-05-13 (×4): qty 1

## 2016-05-13 MED ORDER — KETOROLAC TROMETHAMINE 30 MG/ML IJ SOLN: 30.0000 mg | Freq: Four times a day (QID) | INTRAMUSCULAR | Status: AC | PRN

## 2016-05-13 MED ORDER — NALBUPHINE HCL 10 MG/ML IJ SOLN: 5.0000 mg | Freq: Once | INTRAMUSCULAR | Status: AC | PRN

## 2016-05-13 MED ORDER — ONDANSETRON HCL 4 MG/2ML IJ SOLN
INTRAMUSCULAR | Status: DC | PRN
  Administered 2016-05-13: 4 mg via INTRAVENOUS

## 2016-05-13 MED ORDER — PROMETHAZINE HCL 25 MG/ML IJ SOLN: 6.2500 mg | INTRAMUSCULAR | Status: DC | PRN

## 2016-05-13 MED ORDER — OXYTOCIN 40 UNITS IN LACTATED RINGERS INFUSION - SIMPLE MED: 2.5000 [IU]/h | INTRAVENOUS | Status: AC

## 2016-05-13 MED ORDER — OXYTOCIN 10 UNIT/ML IJ SOLN
INTRAMUSCULAR | Status: AC
  Filled 2016-05-13: qty 4

## 2016-05-13 MED ORDER — BUPIVACAINE IN DEXTROSE 0.75-8.25 % IT SOLN
INTRATHECAL | Status: DC | PRN
  Administered 2016-05-13: 1.4 mL via INTRATHECAL

## 2016-05-13 MED ORDER — OXYCODONE HCL 5 MG PO TABS
10.0000 mg | ORAL_TABLET | ORAL | Status: DC | PRN
  Administered 2016-05-15 (×2): 10 mg via ORAL
  Filled 2016-05-13 (×2): qty 2

## 2016-05-13 MED ORDER — MORPHINE SULFATE-NACL 0.5-0.9 MG/ML-% IV SOSY
PREFILLED_SYRINGE | INTRAVENOUS | Status: AC
  Filled 2016-05-13: qty 1

## 2016-05-13 MED ORDER — SIMETHICONE 80 MG PO CHEW
80.0000 mg | CHEWABLE_TABLET | Freq: Three times a day (TID) | ORAL | Status: DC
  Administered 2016-05-13 – 2016-05-15 (×6): 80 mg via ORAL
  Filled 2016-05-13 (×9): qty 1

## 2016-05-13 MED ORDER — OXYTOCIN 10 UNIT/ML IJ SOLN
INTRAVENOUS | Status: DC | PRN
  Administered 2016-05-13: 40 [IU] via INTRAVENOUS

## 2016-05-13 MED ORDER — LACTATED RINGERS IV SOLN
INTRAVENOUS | Status: DC
  Administered 2016-05-13 (×2): via INTRAVENOUS

## 2016-05-13 MED ORDER — HYDROMORPHONE HCL 1 MG/ML IJ SOLN: 0.2500 mg | INTRAMUSCULAR | Status: DC | PRN

## 2016-05-13 MED ORDER — NALOXONE HCL 2 MG/2ML IJ SOSY
1.0000 ug/kg/h | PREFILLED_SYRINGE | INTRAMUSCULAR | Status: DC | PRN
  Filled 2016-05-13: qty 2

## 2016-05-13 MED ORDER — SENNOSIDES-DOCUSATE SODIUM 8.6-50 MG PO TABS
2.0000 | ORAL_TABLET | ORAL | Status: DC
  Administered 2016-05-14 – 2016-05-15 (×2): 2 via ORAL
  Filled 2016-05-13 (×4): qty 2

## 2016-05-13 MED ORDER — IBUPROFEN 800 MG PO TABS
800.0000 mg | ORAL_TABLET | Freq: Three times a day (TID) | ORAL | Status: DC
  Administered 2016-05-14 – 2016-05-15 (×3): 800 mg via ORAL
  Filled 2016-05-13 (×3): qty 1

## 2016-05-13 SURGICAL SUPPLY — 40 items
APL SKNCLS STERI-STRIP NONHPOA (GAUZE/BANDAGES/DRESSINGS) ×1
BENZOIN TINCTURE PRP APPL 2/3 (GAUZE/BANDAGES/DRESSINGS) ×3 IMPLANT
CHLORAPREP W/TINT 26ML (MISCELLANEOUS) ×3 IMPLANT
CLAMP CORD UMBIL (MISCELLANEOUS) IMPLANT
CLOSURE WOUND 1/2 X4 (GAUZE/BANDAGES/DRESSINGS) ×1
CLOTH BEACON ORANGE TIMEOUT ST (SAFETY) ×3 IMPLANT
CONTAINER PREFILL 10% NBF 15ML (MISCELLANEOUS) IMPLANT
DRSG OPSITE POSTOP 4X10 (GAUZE/BANDAGES/DRESSINGS) ×3 IMPLANT
ELECT REM PT RETURN 9FT ADLT (ELECTROSURGICAL) ×3
ELECTRODE REM PT RTRN 9FT ADLT (ELECTROSURGICAL) ×1 IMPLANT
EXTRACTOR VACUUM M CUP 4 TUBE (SUCTIONS) IMPLANT
EXTRACTOR VACUUM M CUP 4' TUBE (SUCTIONS)
GLOVE BIO SURGEON STRL SZ 6.5 (GLOVE) ×2 IMPLANT
GLOVE BIO SURGEONS STRL SZ 6.5 (GLOVE) ×1
GLOVE BIOGEL PI IND STRL 7.0 (GLOVE) ×1 IMPLANT
GLOVE BIOGEL PI INDICATOR 7.0 (GLOVE) ×2
GOWN STRL REUS W/TWL LRG LVL3 (GOWN DISPOSABLE) ×6 IMPLANT
KIT ABG SYR 3ML LUER SLIP (SYRINGE) IMPLANT
NDL HYPO 25X5/8 SAFETYGLIDE (NEEDLE) IMPLANT
NEEDLE HYPO 25X5/8 SAFETYGLIDE (NEEDLE) IMPLANT
NS IRRIG 1000ML POUR BTL (IV SOLUTION) ×3 IMPLANT
PACK C SECTION WH (CUSTOM PROCEDURE TRAY) ×3 IMPLANT
PAD OB MATERNITY 4.3X12.25 (PERSONAL CARE ITEMS) ×3 IMPLANT
PENCIL SMOKE EVAC W/HOLSTER (ELECTROSURGICAL) ×3 IMPLANT
RTRCTR C-SECT PINK 25CM LRG (MISCELLANEOUS) ×3 IMPLANT
STRIP CLOSURE SKIN 1/2X4 (GAUZE/BANDAGES/DRESSINGS) ×2 IMPLANT
SUT MNCRL 0 VIOLET CTX 36 (SUTURE) ×2 IMPLANT
SUT MONOCRYL 0 CTX 36 (SUTURE) ×4
SUT PLAIN 1 NONE 54 (SUTURE) IMPLANT
SUT PLAIN 2 0 XLH (SUTURE) ×3 IMPLANT
SUT VIC AB 0 CT1 27 (SUTURE) ×6
SUT VIC AB 0 CT1 27XBRD ANBCTR (SUTURE) ×2 IMPLANT
SUT VIC AB 2-0 CT1 27 (SUTURE) ×3
SUT VIC AB 2-0 CT1 TAPERPNT 27 (SUTURE) ×1 IMPLANT
SUT VIC AB 3-0 SH 27 (SUTURE) ×3
SUT VIC AB 3-0 SH 27X BRD (SUTURE) IMPLANT
SUT VIC AB 4-0 KS 27 (SUTURE) ×3 IMPLANT
SYR BULB IRRIGATION 50ML (SYRINGE) ×3 IMPLANT
TOWEL OR 17X24 6PK STRL BLUE (TOWEL DISPOSABLE) ×3 IMPLANT
TRAY FOLEY CATH SILVER 14FR (SET/KITS/TRAYS/PACK) ×3 IMPLANT

## 2016-05-13 NOTE — Addendum Note (Signed)
Addendum  created 05/13/16 1411 by Elbert Ewingsolleen S Jamesmichael Shadd, CRNA   Sign clinical note

## 2016-05-13 NOTE — Brief Op Note (Signed)
05/13/2016  10:35 AM  PATIENT:  Gladstone LighterAmber D Ringenberg  21 y.o. female  PRE-OPERATIVE DIAGNOSIS:  breech  POST-OPERATIVE DIAGNOSIS:  breech  PROCEDURE:  Procedure(s) with comments: CESAREAN SECTION (N/A) - MD requests RNFA- Genice Rougeracey Tucker  FINDINGS: viable female infant at 9:47, apgars 9/9 at 1 and 5 minutes; wt P, nl PP uterus, tubes and ovaries  SURGEON:  Surgeon(s) and Role:    * Sherian ReinJody Bovard-Stuckert, MD - Primary  ANESTHESIA:   spinal  EBL:  Total I/O In: 2000 [I.V.:2000] Out: 750 [Urine:100; Blood:650]  BLOOD ADMINISTERED:none  DRAINS: Urinary Catheter (Foley)   LOCAL MEDICATIONS USED:  NONE  SPECIMEN:  Source of Specimen:  Placenta  DISPOSITION OF SPECIMEN:  L&D  COUNTS:  YES  TOURNIQUET:  * No tourniquets in log *  DICTATION: .Other Dictation: Dictation Number [id number]  PLAN OF CARE: Admit to inpatient   PATIENT DISPOSITION:  PACU - hemodynamically stable.   Delay start of Pharmacological VTE agent (>24hrs) due to surgical blood loss or risk of bleeding: not applicable

## 2016-05-13 NOTE — Transfer of Care (Signed)
Immediate Anesthesia Transfer of Care Note  Patient: Madison LighterAmber D Bermea  Procedure(s) Performed: Procedure(s) with comments: CESAREAN SECTION (N/A) - MD requests RNFA- Genice Rougeracey Tucker  Patient Location: PACU  Anesthesia Type:Spinal  Level of Consciousness: awake, alert , oriented and patient cooperative  Airway & Oxygen Therapy: Patient Spontanous Breathing  Post-op Assessment: Report given to RN and Post -op Vital signs reviewed and stable  Post vital signs: Reviewed and stable  Last Vitals:  Vitals:   05/13/16 0755  BP: 133/81  Pulse: 82  Resp: 18  Temp: 36.4 C    Last Pain:  Vitals:   05/13/16 0755  TempSrc: Oral         Complications: No apparent anesthesia complications

## 2016-05-13 NOTE — Anesthesia Postprocedure Evaluation (Signed)
Anesthesia Post Note  Patient: Hospital doctorAmber D Millward  Procedure(s) Performed: Procedure(s) (LRB): CESAREAN SECTION (N/A)  Anesthesia Type: Spinal Level of consciousness: awake Pain management: pain level controlled Vital Signs Assessment: post-procedure vital signs reviewed and stable Respiratory status: spontaneous breathing Cardiovascular status: stable Postop Assessment: no headache, no backache, spinal receding, patient able to bend at knees and no signs of nausea or vomiting Anesthetic complications: no        Last Vitals:  Vitals:   05/13/16 1042 05/13/16 1200  BP:  (!) 116/54  Pulse: 96 87  Resp: 16 15  Temp: 36.6 C 36.4 C    Last Pain:  Vitals:   05/13/16 1200  TempSrc: Axillary   Pain Goal:                 Rexann Lueras JR,JOHN Dixie Coppa

## 2016-05-13 NOTE — Lactation Note (Signed)
This note was copied from a baby's chart. Lactation Consultation Note  Patient Name: Boy Butch Pennymber Goodspeed ZOXWR'UToday's Date: 05/13/2016 Reason for consult: Initial assessment Breastfeeding consultation services and support information given and reviewed.  This is mom's second baby and newborn is 5 hours old.  Mom states baby has had one successful latch but the last attempt was difficult.  Report from RN that mom has inverted/flat nipples.  I gave shells and hand pump with instructions.  RN informed she will need help putting bra on for shells.  Baby is currently sleeping.  Instructed mom to call for feeding assist when baby starts to cue.  Maternal Data Does the patient have breastfeeding experience prior to this delivery?: Yes  Feeding Feeding Type: Breast Fed Length of feed: 10 min  LATCH Score/Interventions Latch:  (encouraged to call for latch assessment)                    Lactation Tools Discussed/Used Tools: Shells;Pump Shell Type: Inverted Breast pump type: Manual   Consult Status Consult Status: Follow-up Date: 05/14/16 Follow-up type: In-patient    Huston FoleyMOULDEN, Tyne Banta S 05/13/2016, 3:09 PM

## 2016-05-13 NOTE — Anesthesia Postprocedure Evaluation (Addendum)
Anesthesia Post Note  Patient: Gladstone LighterAmber D Rhyne  Procedure(s) Performed: Procedure(s) (LRB): CESAREAN SECTION (N/A)  Patient location during evaluation: Mother Baby Anesthesia Type: Spinal Level of consciousness: awake, awake and alert and oriented Pain management: pain level controlled Vital Signs Assessment: post-procedure vital signs reviewed and stable Respiratory status: spontaneous breathing Cardiovascular status: stable Postop Assessment: no headache, no backache, patient able to bend at knees, no signs of nausea or vomiting and adequate PO intake Anesthetic complications: no        Last Vitals:  Vitals:   05/13/16 1200 05/13/16 1300  BP: (!) 116/54 120/65  Pulse: 87 81  Resp: 15 18  Temp: 36.4 C 36.8 C    Last Pain:  Vitals:   05/13/16 1300  TempSrc: Axillary  PainSc: 4    Pain Goal: Patients Stated Pain Goal: 3 (05/13/16 1300)               RHYMER,COLLEEN

## 2016-05-13 NOTE — Interval H&P Note (Signed)
History and Physical Interval Note:  05/13/2016 8:41 AM  Madison Richmond  has presented today for surgery, with the diagnosis of breech  The various methods of treatment have been discussed with the patient and family. After consideration of risks, benefits and other options for treatment, the patient has consented to  Procedure(s) with comments: CESAREAN SECTION (N/A) - MD requests RNFA- Genice Rougeracey Tucker as a surgical intervention .  The patient's history has been reviewed, patient examined, no change in status, stable for surgery.  I have reviewed the patient's chart and labs.  Questions were answered to the patient's satisfaction.  Verified breech by BS Ultrasound.       Bovard-Stuckert, Yukio Bisping

## 2016-05-13 NOTE — Anesthesia Preprocedure Evaluation (Signed)
Anesthesia Evaluation  Patient identified by MRN, date of birth, ID band Patient awake    Reviewed: Allergy & Precautions, H&P , NPO status , Patient's Chart, lab work & pertinent test results  Airway Mallampati: II  TM Distance: >3 FB Neck ROM: full    Dental no notable dental hx.    Pulmonary former smoker,    Pulmonary exam normal        Cardiovascular negative cardio ROS Normal cardiovascular exam     Neuro/Psych negative neurological ROS  negative psych ROS   GI/Hepatic negative GI ROS, Neg liver ROS,   Endo/Other  negative endocrine ROS  Renal/GU negative Renal ROS     Musculoskeletal   Abdominal (+) + obese,   Peds  Hematology negative hematology ROS (+)   Anesthesia Other Findings   Reproductive/Obstetrics (+) Pregnancy                             Anesthesia Physical Anesthesia Plan  ASA: II  Anesthesia Plan: Spinal   Post-op Pain Management:    Induction:   Airway Management Planned:   Additional Equipment:   Intra-op Plan:   Post-operative Plan:   Informed Consent: I have reviewed the patients History and Physical, chart, labs and discussed the procedure including the risks, benefits and alternatives for the proposed anesthesia with the patient or authorized representative who has indicated his/her understanding and acceptance.     Plan Discussed with: CRNA and Surgeon  Anesthesia Plan Comments:         Anesthesia Quick Evaluation  

## 2016-05-13 NOTE — Anesthesia Procedure Notes (Signed)
Spinal  Patient location during procedure: OR Start time: 05/13/2016 9:26 AM End time: 05/13/2016 9:30 AM Staffing Anesthesiologist: Leilani AbleHATCHETT, Solaris Kram Performed: anesthesiologist  Preanesthetic Checklist Completed: patient identified, surgical consent, pre-op evaluation, timeout performed, IV checked, risks and benefits discussed and monitors and equipment checked Spinal Block Patient position: sitting Prep: DuraPrep Patient monitoring: heart rate, cardiac monitor, continuous pulse ox and blood pressure Approach: midline Location: L3-4 Injection technique: single-shot Needle Needle type: Sprotte  Needle gauge: 24 G Needle length: 9 cm Needle insertion depth: 6 cm Assessment Sensory level: T4

## 2016-05-14 ENCOUNTER — Encounter (HOSPITAL_COMMUNITY): Payer: Self-pay | Admitting: Obstetrics and Gynecology

## 2016-05-14 LAB — BIRTH TISSUE RECOVERY COLLECTION (PLACENTA DONATION)

## 2016-05-14 LAB — TYPE AND SCREEN
ABO/RH(D): A NEG
Antibody Screen: NEGATIVE
Weak D: POSITIVE

## 2016-05-14 LAB — CBC
HCT: 28 % — ABNORMAL LOW (ref 36.0–46.0)
HEMOGLOBIN: 9.5 g/dL — AB (ref 12.0–15.0)
MCH: 28.5 pg (ref 26.0–34.0)
MCHC: 33.9 g/dL (ref 30.0–36.0)
MCV: 84.1 fL (ref 78.0–100.0)
Platelets: 278 10*3/uL (ref 150–400)
RBC: 3.33 MIL/uL — AB (ref 3.87–5.11)
RDW: 15 % (ref 11.5–15.5)
WBC: 10.2 10*3/uL (ref 4.0–10.5)

## 2016-05-14 LAB — KLEIHAUER-BETKE STAIN
# Vials RhIg: 1
Fetal Cells %: 0 %
Quantitation Fetal Hemoglobin: 0 mL

## 2016-05-14 MED ORDER — MAGNESIUM HYDROXIDE 400 MG/5ML PO SUSP: 5.0000 mL | Freq: Every day | ORAL | Status: DC | PRN

## 2016-05-14 MED ORDER — BISACODYL 10 MG RE SUPP
10.0000 mg | Freq: Every day | RECTAL | Status: DC | PRN
  Administered 2016-05-14: 10 mg via RECTAL
  Filled 2016-05-14: qty 1

## 2016-05-14 MED ORDER — RHO D IMMUNE GLOBULIN 1500 UNIT/2ML IJ SOSY
300.0000 ug | PREFILLED_SYRINGE | Freq: Once | INTRAMUSCULAR | Status: AC
  Administered 2016-05-14: 300 ug via INTRAVENOUS
  Filled 2016-05-14: qty 2

## 2016-05-14 NOTE — Progress Notes (Signed)
Subjective: Postpartum Day 1 Cesarean Delivery Patient reports tolerating PO and no problems voiding.    Objective: Vital signs in last 24 hours: Temp:  [97.5 F (36.4 C)-99.3 F (37.4 C)] 98.8 F (37.1 C) (01/10 0410) Pulse Rate:  [80-100] 82 (01/10 0410) Resp:  [15-19] 16 (01/10 0410) BP: (101-121)/(50-65) 108/60 (01/10 0550) SpO2:  [95 %-99 %] 99 % (01/10 0550)  Physical Exam:  General: alert and cooperative Lochia: appropriate Uterine Fundus: firm Incision: C/D/I    Recent Labs  05/12/16 1010 05/14/16 0504  HGB 12.1 9.5*  HCT 35.5* 28.0*    Assessment/Plan: Status post Cesarean section. Doing well postoperatively.  Continue current care. D/Richmond pt circumcision and she confirms plans to do in the office.  Madison Richmond,Madison Richmond 05/14/2016, 9:09 AM

## 2016-05-14 NOTE — Progress Notes (Signed)
Patient still complaining of discomfort in abdomen and the feeling of needing to have a bowel movement.  However, pain at incision did improve greatly.  Md called to request a suppository order.  Orders received.  Suppository given, will continue to monitor.  Vivi MartensAshley Millie Shorb RN

## 2016-05-14 NOTE — Lactation Note (Addendum)
This note was copied from a baby's chart. Lactation Consultation Note  Patient Name: Madison Richmond Today's Date:  Reason for consult: Follow-up assessment   With this mom and term baby, now 7826 hours old. Mom has flat nipples, and they are extremely sore, red, scabbed, and mom asking for formula, saying her baby is hungry. Mom agreed to trying a 20 nipple shield, filled with formula. The baby latched deeply, with stongl rhythmic suckles. Mom said she still had discomfort with latch, but it was tolerable. . I left mom with a bottle of formula, but advised mom to keep baby latched while he was content at the breast, and offer formula after.  Mom has had the same issue with her first baby. I told her to talk to dad, and call me back if she wants to pump. She was not consistent with pumping last time, and had a low milk supply. She knows how much of a commitment pumping is, so I told her to let me know what decision she makes.  On exam of baby's mouth, he has a very short, almost anterior lingual frenulum, with very limited tongue extension. I showed this to the baby's parents, explained that this is why latching is so difficult for him, and told her lactation could work with her , if she wanted. Mom to call lactation as needed.   Maternal Data    Feeding Feeding Type: Bottle Fed - Formula Nipple Type: Slow - flow Length of feed: 25 min  LATCH Score/Interventions Latch: Grasps breast easily, tongue down, lips flanged, rhythmical sucking. (with nipple shield primed with formula) Intervention(s): Adjust position;Assist with latch  Audible Swallowing: A few with stimulation (swallows heard with formula) Intervention(s): Skin to skin;Hand expression  Type of Nipple: Flat Intervention(s): Reverse pressure;Shells;Hand pump  Comfort (Breast/Nipple): Filling, red/small blisters or bruises, mild/mod discomfort  Problem noted: Severe discomfort  Hold (Positioning): Assistance needed to correctly  position infant at breast and maintain latch. Intervention(s): Breastfeeding basics reviewed;Support Pillows;Position options;Skin to skin  LATCH Score: 6  Lactation Tools Discussed/Used Tools: Nipple Shields Nipple shield size: 20   Consult Status Consult Status: Follow-up Date: 05/15/16 Follow-up type: In-patient    Alfred LevinsLee, Jazzma Neidhardt Anne 05/14/2016, 12:07 PM

## 2016-05-14 NOTE — Progress Notes (Signed)
UR chart review completed.  

## 2016-05-14 NOTE — Op Note (Signed)
NAMButch Penny:  Delavega, Mayuri              ACCOUNT NO.:  [id number]  MEDICAL RECORD NO.:  [id number]  LOCATION:  PERIO                         FACILITY:  [facility]  PHYSICIAN:  Sherron MondayJody Bovard, MD        DATE OF BIRTH:  10/08/1995  DATE OF PROCEDURE:  05/13/2016 DATE OF DISCHARGE:                              OPERATIVE REPORT   PREOPERATIVE DIAGNOSES:  Intrauterine pregnancy at 39+ weeks, breech presentation, desires low-transverse cesarean section.  POSTOPERATIVE DIAGNOSES:  Intrauterine pregnancy at 39+ weeks, breech presentation, desires low-transverse cesarean section, delivered.  PROCEDURE:  Low-transverse cesarean section.  FINDINGS:  Viable female infant at 9:47 a.m. with Apgars of 9 at 1 minute, 9 at 5 minutes.  Weight pending at the time of dictation.  Normal postpartum uterus, tubes, and ovaries.  SURGEON:  Sherron MondayJody Bovard, MD  ANESTHESIA:  Spinal.  ESTIMATED BLOOD LOSS:  Approximately, 650 mL.  URINE OUTPUT:  100 mL clear urine at the end of the procedure.  IV FLUIDS:  2000 mL.  COMPLICATIONS:  None.  PATHOLOGY:  Placenta to Labor and Delivery.  PROCEDURE:  After informed consent was reviewed with the patient and her partner, she was transported to the OR where spinal anesthesia was in place and found to be adequate.  She was then returned to the supine position with a leftward tilt, prepped and draped in the normal sterile fashion.  A Foley catheter was sterilely placed.  A Pfannenstiel skin incision was made, carried through the underlying layer of fascia sharply.  The fascia and the fascial incision were extended laterally with Mayo scissors.  Superior aspect of the fascial incision was grasped with Kocher clamps elevating the rectus muscles were dissected off both bluntly and sharply.  The midline was easily identified, and the peritoneum was entered bluntly, and the incision was extended superiorly and inferiorly with good visualization of the bladder.  An Alexis  skin retractor was placed carefully making sure that no bowel was entrapped. The uterus inspected.  The uterus was incised in transverse fashion. The infant was delivered from a double footling breech presentation. Nose and mouth were suctioned on the field.  Cord was clamped and cut. Infant was handed off to the waiting pediatric staff.  A minute was awaited before the cord was clamped, and the infant was handed off to the waiting pediatric staff.  The placenta was expressed from the uterus.  The uterus was cleared of all clot and debris.  Uterine incision was closed with 2 layers of 0 Monocryl, the 1st of which is running locked and 2nd was an imbricating layer.  An aspect of bleeding on the left aspect was controlled with 3-0 Vicryl.  The gutters as mentioned were cleared of all clot and debris.  The tubes and ovaries were inspected.  The peritoneum was reapproximated with 2-0 Vicryl. After the Alexis retractor was removed, the fascia was closed with 0 Vicryl in a running fashion.  The subfascial planes were inspected prior to closure and found to be hemostatic.  The subcuticular adipose tissue was made hemostatic with Bovie cautery, and the dead space was closed with 3-0 plain gut.  The skin was closed with 4-0 Vicryl on  a Mellody Dance needle.  Benzoin and Steri-Strips were applied.  The patient tolerated the procedure well.  Sponge, lap, and needle counts were correct x2.     Sherron Monday, MD     JB/MEDQ  D:  05/13/2016  T:  05/14/2016  Job:  [id number]

## 2016-05-15 LAB — RH IG WORKUP (INCLUDES ABO/RH)
ABO/RH(D): A NEG
GESTATIONAL AGE(WKS): 39
Unit division: 0

## 2016-05-15 MED ORDER — OXYCODONE HCL 5 MG PO TABS: 5.0000 mg | ORAL_TABLET | ORAL | 0 refills | Status: AC | PRN

## 2016-05-15 MED ORDER — IBUPROFEN 600 MG PO TABS: 600.0000 mg | ORAL_TABLET | Freq: Four times a day (QID) | ORAL | 0 refills | Status: DC | PRN

## 2016-05-15 NOTE — Discharge Instructions (Signed)
As per discharge pamphlet °

## 2016-05-15 NOTE — Discharge Summary (Signed)
    OB Discharge Summary     Patient Name: Madison Richmond DOB: 11/30/1995 MRN: [id number]  Date of admission: 05/13/2016 Delivering MD: Sherian ReinBOVARD-STUCKERT, JODY   Date of discharge: 05/15/2016  Admitting diagnosis: breech Intrauterine pregnancy: 432w0d     Secondary diagnosis:  Principal Problem:   S/P cesarean section Active Problems:   Breech presentation on examination, fetus 1      Discharge diagnosis: Term Pregnancy Delivered                                    Hospital course:  Sceduled C/S   21 y.o. yo G2P2002 at 322w0d was admitted to the hospital 05/13/2016 for scheduled cesarean section with the following indication:Malpresentation.  Membrane Rupture Time/Date: 9:46 AM ,05/13/2016   Patient delivered a Viable infant.05/13/2016  Details of operation can be found in separate operative note.  Pateint had an uncomplicated postpartum course.  [name], tolerating a regular diet, passing flatus, and urinating well. Patient is discharged home in stable condition on  05/15/16          Physical exam Vitals:   05/14/16 0410 05/14/16 0550 05/14/16 1800 05/15/16 0535  BP: (!) 101/55 108/60 123/62 (!) 104/50  Pulse: 82  85 77  Resp: 16  18 18   Temp: 98.8 F (37.1 C)  98.1 F (36.7 C) 98.3 F (36.8 C)  TempSrc: Oral  Oral Oral  SpO2: 98% 99%    Weight:      Height:       General: alert Lochia: appropriate Uterine Fundus: firm Incision: Healing well with no significant drainage  Labs: Lab Results  Component Value Date   WBC 10.2 05/14/2016   HGB 9.5 (L) 05/14/2016   HCT 28.0 (L) 05/14/2016   MCV 84.1 05/14/2016   PLT 278 05/14/2016   No flowsheet data found.  Discharge instruction: per After Visit Summary and "Baby and Me Booklet".  After visit meds:  Allergies as of 05/15/2016   No Known Allergies     Medication List    TAKE these medications   ibuprofen 600 MG tablet Commonly known as:  ADVIL,MOTRIN Take 1 tablet (600 mg total) by mouth every 6 (six) hours  as needed for moderate pain.   oxyCODONE 5 MG immediate release tablet Commonly known as:  Oxy IR/ROXICODONE Take 1 tablet (5 mg total) by mouth every 4 (four) hours as needed for severe pain.   prenatal multivitamin Tabs tablet Take 1 tablet by mouth daily at 12 noon.       Diet: routine diet  Activity: Advance as tolerated. Pelvic rest for 6 weeks.   Outpatient follow up:2 weeks   Newborn Data: Live born female  Birth Weight: 8 lb 13.6 oz (4015 g) APGAR: 9, 9  Baby Feeding: Breast Disposition:home with mother   05/15/2016 Zenaida NieceMEISINGER,Dhruti Ghuman D, MD

## 2016-05-15 NOTE — Progress Notes (Signed)
POD #2 Doing ok, sore but improved, wants to go home Afeb, VSS Abd- soft, fundus firm, incision intact D/c home

## 2016-10-10 NOTE — Addendum Note (Signed)
Addendum  created 10/10/16 0859 by Cyara Devoto, MD   Sign clinical note    

## 2017-06-29 ENCOUNTER — Inpatient Hospital Stay (HOSPITAL_COMMUNITY): Payer: BLUE CROSS/BLUE SHIELD

## 2017-06-29 ENCOUNTER — Encounter (HOSPITAL_COMMUNITY): Payer: Self-pay | Admitting: *Deleted

## 2017-06-29 ENCOUNTER — Other Ambulatory Visit: Payer: Self-pay

## 2017-06-29 ENCOUNTER — Inpatient Hospital Stay (HOSPITAL_COMMUNITY)
Admission: AD | Admit: 2017-06-29 | Discharge: 2017-06-29 | Disposition: A | Discharge status: Home / Self Care | Payer: BLUE CROSS/BLUE SHIELD | Source: Ambulatory Visit | Attending: Obstetrics and Gynecology | Admitting: Obstetrics and Gynecology

## 2017-06-29 DIAGNOSIS — R109 Unspecified abdominal pain: Secondary | ICD-10-CM

## 2017-06-29 DIAGNOSIS — Z3491 Encounter for supervision of normal pregnancy, unspecified, first trimester: Secondary | ICD-10-CM

## 2017-06-29 DIAGNOSIS — N898 Other specified noninflammatory disorders of vagina: Secondary | ICD-10-CM | POA: Insufficient documentation

## 2017-06-29 DIAGNOSIS — O26899 Other specified pregnancy related conditions, unspecified trimester: Secondary | ICD-10-CM

## 2017-06-29 DIAGNOSIS — O26891 Other specified pregnancy related conditions, first trimester: Secondary | ICD-10-CM | POA: Diagnosis not present

## 2017-06-29 DIAGNOSIS — Z8249 Family history of ischemic heart disease and other diseases of the circulatory system: Secondary | ICD-10-CM | POA: Insufficient documentation

## 2017-06-29 DIAGNOSIS — Z3A01 Less than 8 weeks gestation of pregnancy: Secondary | ICD-10-CM | POA: Diagnosis not present

## 2017-06-29 DIAGNOSIS — R1030 Lower abdominal pain, unspecified: Secondary | ICD-10-CM | POA: Insufficient documentation

## 2017-06-29 DIAGNOSIS — O34219 Maternal care for unspecified type scar from previous cesarean delivery: Secondary | ICD-10-CM | POA: Insufficient documentation

## 2017-06-29 DIAGNOSIS — Z87891 Personal history of nicotine dependence: Secondary | ICD-10-CM | POA: Diagnosis not present

## 2017-06-29 LAB — WET PREP, GENITAL
Clue Cells Wet Prep HPF POC: NONE SEEN
Sperm: NONE SEEN
Trich, Wet Prep: NONE SEEN
Yeast Wet Prep HPF POC: NONE SEEN

## 2017-06-29 LAB — URINALYSIS, ROUTINE W REFLEX MICROSCOPIC
BILIRUBIN URINE: NEGATIVE
Glucose, UA: NEGATIVE mg/dL
Hgb urine dipstick: NEGATIVE
Ketones, ur: NEGATIVE mg/dL
Nitrite: NEGATIVE
PH: 6 (ref 5.0–8.0)
Protein, ur: NEGATIVE mg/dL
Specific Gravity, Urine: 1.02 (ref 1.005–1.030)

## 2017-06-29 LAB — CBC
HEMATOCRIT: 38.4 % (ref 36.0–46.0)
HEMOGLOBIN: 13.1 g/dL (ref 12.0–15.0)
MCH: 28.9 pg (ref 26.0–34.0)
MCHC: 34.1 g/dL (ref 30.0–36.0)
MCV: 84.6 fL (ref 78.0–100.0)
Platelets: 275 10*3/uL (ref 150–400)
RBC: 4.54 MIL/uL (ref 3.87–5.11)
RDW: 13.5 % (ref 11.5–15.5)
WBC: 8.3 10*3/uL (ref 4.0–10.5)

## 2017-06-29 LAB — ABO/RH
ABO/RH(D): A NEG
Weak D: POSITIVE

## 2017-06-29 LAB — HCG, QUANTITATIVE, PREGNANCY: HCG, BETA CHAIN, QUANT, S: 79242 m[IU]/mL — AB (ref <5)

## 2017-06-29 LAB — POCT PREGNANCY, URINE: PREG TEST UR: POSITIVE — AB

## 2017-06-29 NOTE — Discharge Instructions (Signed)

## 2017-06-29 NOTE — MAU Provider Note (Addendum)
History     CSN: [id number]  Arrival date and time: 06/29/17 1802  Chief Complaint  Patient presents with  . Abdominal Pain  . Possible Pregnancy  . Vaginal Discharge   HPI   Ms.Madison Richmond is a 22 y.o. female G45P2002 @ unkown here in MAU with possible pregnancy. Says she took a test at home and it was positive. States she has been having lower abdominal cramping off and on. No bleeding. Was taking the pill, however irregularly. Has not taken anything for the pain.  Unplanned pregnancy. See's Dr. Ellyn Hack.   OB History    Gravida Para Term Preterm AB Living   3 2 2  0 0 2   SAB TAB Ectopic Multiple Live Births   0 0 0 0 2      Past Medical History:  Diagnosis Date  . Breech presentation on examination, fetus 1 05/12/2016  . Chlamydia   . History of gonorrhea   . S/P cesarean section 05/13/2016  . SVD (spontaneous vaginal delivery) 12/01/2011  . Vaginal Pap smear, abnormal     Past Surgical History:  Procedure Laterality Date  . CESAREAN SECTION N/A 05/13/2016   Procedure: CESAREAN SECTION;  Surgeon: Sherian Rein, MD;  Location: [facility];  Service: Obstetrics;  Laterality: N/A;  MD requests RNFA- Genice Rouge  . EYE SURGERY     blocked L tear duct  . Surgery for blocked tear duct      Family History  Problem Relation Age of Onset  . Hypertension Mother     Social History   Tobacco Use  . Smoking status: Former Smoker    Types: Cigarettes  . Smokeless tobacco: Never Used  Substance Use Topics  . Alcohol use: No  . Drug use: No    Allergies: No Known Allergies  Medications Prior to Admission  Medication Sig Dispense Refill Last Dose  . ibuprofen (ADVIL,MOTRIN) 600 MG tablet Take 1 tablet (600 mg total) by mouth every 6 (six) hours as needed for moderate pain. 30 tablet 0   . Prenatal Vit-Fe Fumarate-FA (PRENATAL MULTIVITAMIN) TABS tablet Take 1 tablet by mouth daily at 12 noon.       Results for orders placed or performed during the  hospital encounter of 06/29/17 (from the past 48 hour(s))  Urinalysis, Routine w reflex microscopic     Status: Abnormal   Collection Time: 06/29/17  6:33 PM  Result Value Ref Range   Color, Urine YELLOW YELLOW   APPearance HAZY (A) CLEAR   Specific Gravity, Urine 1.020 1.005 - 1.030   pH 6.0 5.0 - 8.0   Glucose, UA NEGATIVE NEGATIVE mg/dL   Hgb urine dipstick NEGATIVE NEGATIVE   Bilirubin Urine NEGATIVE NEGATIVE   Ketones, ur NEGATIVE NEGATIVE mg/dL   Protein, ur NEGATIVE NEGATIVE mg/dL   Nitrite NEGATIVE NEGATIVE   Leukocytes, UA MODERATE (A) NEGATIVE   RBC / HPF 0-5 0 - 5 RBC/hpf   WBC, UA 6-30 0 - 5 WBC/hpf   Bacteria, UA RARE (A) NONE SEEN   Squamous Epithelial / LPF 6-30 (A) NONE SEEN   Mucus PRESENT     Comment: Performed at [facility], [address], [place], [place] [address]  Pregnancy, urine POC     Status: Abnormal   Collection Time: 06/29/17  6:47 PM  Result Value Ref Range   Preg Test, Ur POSITIVE (A) NEGATIVE    Comment:        THE SENSITIVITY OF THIS METHODOLOGY IS >24 mIU/mL  CBC     Status: None   Collection Time: 06/29/17  7:59 PM  Result Value Ref Range   WBC 8.3 4.0 - 10.5 K/uL   RBC 4.54 3.87 - 5.11 MIL/uL   Hemoglobin 13.1 12.0 - 15.0 g/dL   HCT 16.138.4 09.636.0 - 04.546.0 %   MCV 84.6 78.0 - 100.0 fL   MCH 28.9 26.0 - 34.0 pg   MCHC 34.1 30.0 - 36.0 g/dL   RDW 40.913.5 81.111.5 - 91.415.5 %   Platelets 275 150 - 400 K/uL    Comment: Performed at [facility], [address] [place], [place] [address]   Review of Systems  Constitutional: Negative for fever.  Genitourinary: Positive for vaginal discharge. Negative for vaginal bleeding.   Physical Exam   Blood pressure 118/72, pulse (!) 112, temperature 98 F (36.7 C), temperature source Oral, resp. rate 18, weight 139 lb (63 kg), SpO2 97 %, unknown if currently breastfeeding.  Physical Exam  Constitutional: She is oriented to person, place, and time. She appears well-developed and well-nourished. No  distress.  HENT:  Head: Normocephalic.  Eyes: Pupils are equal, round, and reactive to light.  GI: Soft. She exhibits no distension. There is no tenderness. There is no rebound and no guarding.  Genitourinary:  Genitourinary Comments: Bimanual exam: Cervix closed Uterus non tender, enlarged  Adnexa non tender, no masses bilaterally GC/Chlam, wet prep done Chaperone present for exam.   Musculoskeletal: Normal range of motion.  Neurological: She is alert and oriented to person, place, and time.  Skin: Skin is warm. She is not diaphoretic.  Psychiatric: Her behavior is normal.   Results for orders placed or performed during the hospital encounter of 06/29/17 (from the past 24 hour(s))  Urinalysis, Routine w reflex microscopic     Status: Abnormal   Collection Time: 06/29/17  6:33 PM  Result Value Ref Range   Color, Urine YELLOW YELLOW   APPearance HAZY (A) CLEAR   Specific Gravity, Urine 1.020 1.005 - 1.030   pH 6.0 5.0 - 8.0   Glucose, UA NEGATIVE NEGATIVE mg/dL   Hgb urine dipstick NEGATIVE NEGATIVE   Bilirubin Urine NEGATIVE NEGATIVE   Ketones, ur NEGATIVE NEGATIVE mg/dL   Protein, ur NEGATIVE NEGATIVE mg/dL   Nitrite NEGATIVE NEGATIVE   Leukocytes, UA MODERATE (A) NEGATIVE   RBC / HPF 0-5 0 - 5 RBC/hpf   WBC, UA 6-30 0 - 5 WBC/hpf   Bacteria, UA RARE (A) NONE SEEN   Squamous Epithelial / LPF 6-30 (A) NONE SEEN   Mucus PRESENT   Pregnancy, urine POC     Status: Abnormal   Collection Time: 06/29/17  6:47 PM  Result Value Ref Range   Preg Test, Ur POSITIVE (A) NEGATIVE  CBC     Status: None   Collection Time: 06/29/17  7:59 PM  Result Value Ref Range   WBC 8.3 4.0 - 10.5 K/uL   RBC 4.54 3.87 - 5.11 MIL/uL   Hemoglobin 13.1 12.0 - 15.0 g/dL   HCT 62.138.4 30.836.0 - 65.746.0 %   MCV 84.6 78.0 - 100.0 fL   MCH 28.9 26.0 - 34.0 pg   MCHC 34.1 30.0 - 36.0 g/dL   RDW 84.613.5 96.211.5 - 95.215.5 %   Platelets 275 150 - 400 K/uL  ABO/Rh     Status: None   Collection Time: 06/29/17  7:59 PM   Result Value Ref Range   ABO/RH(D) A NEG    Weak D      POS  Performed at [facility], [address] [place], [place] [address]   hCG, quantitative, pregnancy     Status: Abnormal   Collection Time: 06/29/17  7:59 PM  Result Value Ref Range   hCG, Beta Chain, Quant, S 79,242 (H) <5 mIU/mL  Wet prep, genital     Status: Abnormal   Collection Time: 06/29/17  8:35 PM  Result Value Ref Range   Yeast Wet Prep HPF POC NONE SEEN NONE SEEN   Trich, Wet Prep NONE SEEN NONE SEEN   Clue Cells Wet Prep HPF POC NONE SEEN NONE SEEN   WBC, Wet Prep HPF POC FEW (A) NONE SEEN   Sperm NONE SEEN    US Ob Comp Less 14 Wks  Result Date: 06/29/2017 CLINICAL DATA:  Cramping EXAM: OBSTETRIC <14 WK [place] AND TRANSVAGINAL OB US TECHNIQUE: Both transabdominal and transvaginal ultrasound examinations were performed for complete evaluation of the gestation as well as the maternal uterus, adnexal regions, and pelvic cul-de-sac. Transvaginal technique was performed to assess early pregnancy. COMPARISON:  None. FINDINGS: Intrauterine gestational sac: Single Yolk sac:  Visualized Embryo:  Visualized Cardiac Activity: Visualized Heart Rate: 129 bpm MSD:   mm    w     d CRL:  7.4 mm   6 w   4 d                  [place] EDC: 02/18/2018 Subchorionic hemorrhage:  None visualized. Maternal uterus/adnexae: 3 cm simple appearing cyst in the left ovary. No suspicious adnexal mass or free fluid. IMPRESSION: Six week 4 day intrauterine pregnancy. Fetal heart rate 129 beats per minute. No acute maternal findings. Electronically Signed   By: Charlett Nose M.D.   On: 06/29/2017 21:22   US Ob Transvaginal  Result Date: 06/29/2017 CLINICAL DATA:  Cramping EXAM: OBSTETRIC <14 WK [place] AND TRANSVAGINAL OB US TECHNIQUE: Both transabdominal and transvaginal ultrasound examinations were performed for complete evaluation of the gestation as well as the maternal uterus, adnexal regions, and pelvic cul-de-sac. Transvaginal technique was performed to  assess early pregnancy. COMPARISON:  None. FINDINGS: Intrauterine gestational sac: Single Yolk sac:  Visualized Embryo:  Visualized Cardiac Activity: Visualized Heart Rate: 129 bpm MSD:   mm    w     d CRL:  7.4 mm   6 w   4 d                  [place] EDC: 02/18/2018 Subchorionic hemorrhage:  None visualized. Maternal uterus/adnexae: 3 cm simple appearing cyst in the left ovary. No suspicious adnexal mass or free fluid. IMPRESSION: Six week 4 day intrauterine pregnancy. Fetal heart rate 129 beats per minute. No acute maternal findings. Electronically Signed   By: Charlett Nose M.D.   On: 06/29/2017 21:22   MAU Course  Procedures  None  MDM  Wet prep & GC HIV, CBC, Hcg, ABO US OB transvaginal  Report given to Donette Larry CNM who resumes care of the patient. Patient awaiting [place].  Rasch, Harolyn Rutherford, NP [place] reviewed, nml IUP. Presentation, clinical findings, and plan discussed with Dr. Ellyn Hack. Stable for discharge home.  Assessment and Plan   1. [redacted] weeks gestation of pregnancy   2. Abdominal pain in pregnancy, antepartum   3. Normal intrauterine pregnancy on prenatal ultrasound in first trimester    Discharge home Follow up in OB office in 4 weeks SAB precautions Start PNV  Allergies as of 06/29/2017   No Known Allergies  Medication List    STOP taking these medications   ibuprofen 600 MG tablet Commonly known as:  ADVIL,MOTRIN     TAKE these medications   prenatal multivitamin Tabs tablet Take 1 tablet by mouth daily at 12 noon.      Donette Larry, CNM  06/29/2017 9:43 PM

## 2017-06-29 NOTE — MAU Note (Signed)
Has been having some cramping.  Can't remember when her last period.  Was not taking her pills correctly.  Faint +HPT today.

## 2017-06-29 NOTE — MAU Note (Signed)
Pt. cannot remember last LMP.

## 2017-06-29 NOTE — MAU Note (Signed)
End of shift report given to Kristen, RN.  

## 2017-06-30 LAB — GC/CHLAMYDIA PROBE AMP (~~LOC~~) NOT AT ARMC
Chlamydia: NEGATIVE
Neisseria Gonorrhea: NEGATIVE

## 2017-06-30 LAB — HIV ANTIBODY (ROUTINE TESTING W REFLEX): HIV Screen 4th Generation wRfx: NONREACTIVE

## 2017-07-10 ENCOUNTER — Inpatient Hospital Stay (HOSPITAL_COMMUNITY)
Admission: AD | Admit: 2017-07-10 | Discharge: 2017-07-10 | Disposition: A | Discharge status: Home / Self Care | Payer: BLUE CROSS/BLUE SHIELD | Source: Ambulatory Visit | Attending: Obstetrics & Gynecology | Admitting: Obstetrics & Gynecology

## 2017-07-10 ENCOUNTER — Encounter (HOSPITAL_COMMUNITY): Payer: Self-pay | Admitting: *Deleted

## 2017-07-10 DIAGNOSIS — N8189 Other female genital prolapse: Secondary | ICD-10-CM | POA: Diagnosis not present

## 2017-07-10 DIAGNOSIS — O3481 Maternal care for other abnormalities of pelvic organs, first trimester: Secondary | ICD-10-CM | POA: Insufficient documentation

## 2017-07-10 DIAGNOSIS — N8110 Cystocele, unspecified: Secondary | ICD-10-CM | POA: Insufficient documentation

## 2017-07-10 DIAGNOSIS — N898 Other specified noninflammatory disorders of vagina: Secondary | ICD-10-CM | POA: Diagnosis present

## 2017-07-10 DIAGNOSIS — Z87891 Personal history of nicotine dependence: Secondary | ICD-10-CM | POA: Diagnosis not present

## 2017-07-10 DIAGNOSIS — Z3A08 8 weeks gestation of pregnancy: Secondary | ICD-10-CM | POA: Diagnosis not present

## 2017-07-10 LAB — URINALYSIS, ROUTINE W REFLEX MICROSCOPIC
BACTERIA UA: NONE SEEN
Bilirubin Urine: NEGATIVE
GLUCOSE, UA: NEGATIVE mg/dL
Hgb urine dipstick: NEGATIVE
Ketones, ur: NEGATIVE mg/dL
Nitrite: NEGATIVE
PROTEIN: NEGATIVE mg/dL
Specific Gravity, Urine: 1.019 (ref 1.005–1.030)
pH: 6 (ref 5.0–8.0)

## 2017-07-10 LAB — WET PREP, GENITAL
CLUE CELLS WET PREP: NONE SEEN
Sperm: NONE SEEN
TRICH WET PREP: NONE SEEN
WBC WET PREP: NONE SEEN
YEAST WET PREP: NONE SEEN

## 2017-07-10 NOTE — MAU Note (Signed)
Last night when strained to have BM noticed a bulge from vagina. Today cont to feel some swelling in vaginal area like something is not right. Some L groin pain. Green vag d/c for 2 days with strange odor.

## 2017-07-10 NOTE — Discharge Instructions (Signed)
Pelvic Organ Prolapse Pelvic organ prolapse is the stretching, bulging, or dropping of pelvic organs into an abnormal position. It happens when the muscles and tissues that surround and support pelvic structures are stretched or weak. Pelvic organ prolapse can involve:  Vagina (vaginal prolapse).  Uterus (uterine prolapse).  Bladder (cystocele).  Rectum (rectocele).  Intestines (enterocele).  When organs other than the vagina are involved, they often bulge into the vagina or protrude from the vagina, depending on how severe the prolapse is. What are the causes? Causes of this condition include:  Pregnancy, labor, and childbirth.  Long-lasting (chronic) cough.  Chronic constipation.  Obesity.  Past pelvic surgery.  Aging. During and after menopause, a decreased production of the hormone estrogen can weaken pelvic ligaments and muscles.  Consistently lifting more than 50 lb (23 kg).  Buildup of fluid in the abdomen due to certain diseases and other conditions.  What are the signs or symptoms? Symptoms of this condition include:  Loss of bladder control when you cough, sneeze, strain, and exercise (stress incontinence). This may be worse immediately following childbirth, and it may gradually improve over time.  Feeling pressure in your pelvis or vagina. This pressure may increase when you cough or when you are having a bowel movement.  A bulge that protrudes from the opening of your vagina or against your vaginal wall. If your uterus protrudes through the opening of your vagina and rubs against your clothing, you may also experience soreness, ulcers, infection, pain, and bleeding.  Increased effort to have a bowel movement or urinate.  Pain in your low back.  Pain, discomfort, or disinterest in sexual intercourse.  Repeated bladder infections (urinary tract infections).  Difficulty inserting or inability to insert a tampon or applicator.  In some people, this  condition does not cause any symptoms. How is this diagnosed? Your health care provider may perform an internal and external vaginal and rectal exam. During the exam, you may be asked to cough and strain while you are lying down, sitting, and standing up. Your health care provider will determine if other tests are required, such as bladder function tests. How is this treated? In most cases, this condition needs to be treated only if it produces symptoms. No treatment is guaranteed to correct the prolapse or relieve the symptoms completely. Treatment may include:  Lifestyle changes, such as: ? Avoiding drinking beverages that contain caffeine. ? Increasing your intake of high-fiber foods. This can help to decrease constipation and straining during bowel movements. ? Emptying your bladder at scheduled times (bladder training therapy). This can help to reduce or avoid urinary incontinence. ? Losing weight if you are overweight or obese.  Estrogen. Estrogen may help mild prolapse by increasing the strength and tone of pelvic floor muscles.  Kegel exercises. These may help mild cases of prolapse by strengthening and tightening the muscles of the pelvic floor.  Pessary insertion. A pessary is a soft, flexible device that is placed into your vagina by your health care provider to help support the vaginal walls and keep pelvic organs in place.  Surgery. This is often the only form of treatment for severe prolapse. Different types of surgeries are available.  Follow these instructions at home:  Wear a sanitary pad or absorbent product if you have urinary incontinence.  Avoid heavy lifting and straining with exercise and work. Do not hold your breath when you perform mild to moderate lifting and exercise activities. Limit your activities as directed by your health care   provider.  Take medicines only as directed by your health care provider.  Perform Kegel exercises as directed by your health care  provider.  If you have a pessary, take care of it as directed by your health care provider. Contact a health care provider if:  Your symptoms interfere with your daily activities or sex life.  You need medicine to help with the discomfort.  You notice bleeding from the vagina that is not related to your period.  You have a fever.  You have pain or bleeding when you urinate.  You have bleeding when you have a bowel movement.  You lose urine when you have sex.  You have chronic constipation.  You have a pessary that falls out.  You have vaginal discharge that has a bad smell.  You have low abdominal pain or cramping that is unusual for you. This information is not intended to replace advice given to you by your health care provider. Make sure you discuss any questions you have with your health care provider. Document Released: 11/16/2013 Document Revised: 09/27/2015 Document Reviewed: 07/04/2013 Elsevier Interactive Patient Education  2018 [employer]  

## 2017-07-11 NOTE — MAU Provider Note (Signed)
Patient Madison Granamber D Okey Richmond is a 22 y.o. G3P2002 At 6945w2d here because she feels like something is "coming out" of her vagina. She denies bleeding, pain or other abnormal discharge.  History     CSN: [id number]  Arrival date and time: 07/10/17 1912   None     Chief Complaint  Patient presents with  . Vaginal Discharge   HPI Patient states that on Thursday night she was constipated. When she attempted to have a bowel movement, she felt something coming out of her vagina. She examined herself with a mirror, and saw pink tissue coming out of her vagina. She came in this evening (Friday night) to be examined. She "pushed the tissue back" and it "stayed in place".   She denies bleeding, NV, dysuria, abnormal discharge or other ob-gyn complaints.  OB History    Gravida Para Term Preterm AB Living   3 2 2  0 0 2   SAB TAB Ectopic Multiple Live Births   0 0 0 0 2      Past Medical History:  Diagnosis Date  . Breech presentation on examination, fetus 1 05/12/2016  . Chlamydia   . History of gonorrhea   . S/P cesarean section 05/13/2016  . SVD (spontaneous vaginal delivery) 12/01/2011  . Vaginal Pap smear, abnormal     Past Surgical History:  Procedure Laterality Date  . CESAREAN SECTION N/A 05/13/2016   Procedure: CESAREAN SECTION;  Surgeon: Sherian ReinJody Bovard-Stuckert, MD;  Location: [facility];  Service: Obstetrics;  Laterality: N/A;  MD requests RNFA- Genice Rougeracey Tucker  . EYE SURGERY     blocked L tear duct  . Surgery for blocked tear duct      Family History  Problem Relation Age of Onset  . Hypertension Mother     Social History   Tobacco Use  . Smoking status: Former Smoker    Types: Cigarettes  . Smokeless tobacco: Never Used  Substance Use Topics  . Alcohol use: No  . Drug use: No    Allergies:  Allergies  Allergen Reactions  . Tetracyclines & Related Other (See Comments)    Causes acid reflux and pain in her stomach    No medications prior to admission.     Review of Systems  Constitutional: Negative.   HENT: Negative.   Respiratory: Negative.   Cardiovascular: Negative.   Gastrointestinal: Negative.   Genitourinary: Negative.   Musculoskeletal: Negative.   Neurological: Negative.   Hematological: Negative.    Physical Exam   Blood pressure 125/70, pulse (!) 101, temperature 98.3 F (36.8 C), resp. rate 18, height 5\' 2"  (1.575 m), weight 140 lb (63.5 kg), last menstrual period 04/28/2017, SpO2 100 %, unknown if currently breastfeeding.  Physical Exam  Constitutional: She is oriented to person, place, and time. She appears well-developed.  HENT:  Head: Normocephalic.  Neck: Normal range of motion.  Respiratory: Effort normal.  GI: Soft.  Genitourinary: Vagina normal.  Musculoskeletal: Normal range of motion.  Neurological: She is alert and oriented to person, place, and time.  Skin: Skin is warm and dry.  Psychiatric: She has a normal mood and affect.   Normal external female genitalia; no lesions or discharge in the vagina. No cystocele or rectocele visualized on intiial exam, however with valsava anterior vaginal wall descends into the vagina.  No CMT, cervix is closed, long and thick.   MAU Course  Procedures  MDM -wet prep negative -UA neg GC chlamydia pending -US at the bedside shows FHR of 162 (  by Schuyler Amor with Lyman Bishop FNP supervising) Assessment and Plan   1. Pelvic organ prolapse quantification stage 1 cystocele    2. Discussed with patient the normal effects of childbirth on pelvic floor. Recommended abdominal strengthening exercises, increased collagen intake (bone broth), and stool softeners.  3. Patient verbalized understanding; ready for discharge.  4. Patient is awaiting pregnancy medicaid and then will start OB care.   Charlesetta Garibaldi Frazer [place] CNM 07/11/2017, 8:04 AM

## 2017-07-13 LAB — GC/CHLAMYDIA PROBE AMP (~~LOC~~) NOT AT ARMC
CHLAMYDIA, DNA PROBE: NEGATIVE
Neisseria Gonorrhea: NEGATIVE

## 2017-07-29 LAB — OB RESULTS CONSOLE GC/CHLAMYDIA
Chlamydia: NEGATIVE
GC PROBE AMP, GENITAL: NEGATIVE

## 2017-07-29 LAB — OB RESULTS CONSOLE HEPATITIS B SURFACE ANTIGEN: Hepatitis B Surface Ag: NEGATIVE

## 2017-07-29 LAB — OB RESULTS CONSOLE ABO/RH: RH Type: POSITIVE

## 2017-07-29 LAB — OB RESULTS CONSOLE ANTIBODY SCREEN: ANTIBODY SCREEN: NEGATIVE

## 2017-07-29 LAB — OB RESULTS CONSOLE RPR: RPR: NONREACTIVE

## 2017-07-29 LAB — OB RESULTS CONSOLE RUBELLA ANTIBODY, IGM: RUBELLA: IMMUNE

## 2017-07-29 LAB — OB RESULTS CONSOLE HIV ANTIBODY (ROUTINE TESTING): HIV: NONREACTIVE

## 2017-12-07 ENCOUNTER — Inpatient Hospital Stay (HOSPITAL_COMMUNITY)
Admission: AD | Admit: 2017-12-07 | Discharge: 2017-12-07 | Disposition: A | Discharge status: Home / Self Care | Payer: BLUE CROSS/BLUE SHIELD | Source: Ambulatory Visit | Attending: Obstetrics and Gynecology | Admitting: Obstetrics and Gynecology

## 2017-12-07 ENCOUNTER — Encounter (HOSPITAL_COMMUNITY): Payer: Self-pay

## 2017-12-07 DIAGNOSIS — Z3A29 29 weeks gestation of pregnancy: Secondary | ICD-10-CM | POA: Diagnosis not present

## 2017-12-07 DIAGNOSIS — O26893 Other specified pregnancy related conditions, third trimester: Secondary | ICD-10-CM | POA: Insufficient documentation

## 2017-12-07 DIAGNOSIS — N200 Calculus of kidney: Secondary | ICD-10-CM | POA: Diagnosis not present

## 2017-12-07 DIAGNOSIS — M549 Dorsalgia, unspecified: Secondary | ICD-10-CM | POA: Insufficient documentation

## 2017-12-07 DIAGNOSIS — Z87891 Personal history of nicotine dependence: Secondary | ICD-10-CM | POA: Insufficient documentation

## 2017-12-07 DIAGNOSIS — O9989 Other specified diseases and conditions complicating pregnancy, childbirth and the puerperium: Secondary | ICD-10-CM | POA: Diagnosis not present

## 2017-12-07 DIAGNOSIS — O26832 Pregnancy related renal disease, second trimester: Secondary | ICD-10-CM | POA: Diagnosis not present

## 2017-12-07 DIAGNOSIS — R350 Frequency of micturition: Secondary | ICD-10-CM

## 2017-12-07 DIAGNOSIS — R102 Pelvic and perineal pain: Secondary | ICD-10-CM | POA: Diagnosis present

## 2017-12-07 LAB — URINALYSIS, ROUTINE W REFLEX MICROSCOPIC
Bacteria, UA: NONE SEEN
Bilirubin Urine: NEGATIVE
Glucose, UA: NEGATIVE mg/dL
Ketones, ur: NEGATIVE mg/dL
Leukocytes, UA: NEGATIVE
Nitrite: NEGATIVE
Protein, ur: NEGATIVE mg/dL
Specific Gravity, Urine: 1.012 (ref 1.005–1.030)
pH: 6 (ref 5.0–8.0)

## 2017-12-07 NOTE — MAU Note (Signed)
Presents to MAU with abdominal  Pain that is intermittent since 1630.  Denies vaginal bleeding, LOF,  +FM.  C/o increased urinary frequency, denies fever, n/v/d

## 2017-12-07 NOTE — MAU Provider Note (Signed)
History     CSN: [id number]  Arrival date and time: 12/07/17 1941   None     Chief Complaint  Patient presents with  . Abdominal Pain  . Back Pain   HPI Madison Richmond is 22 y.o. [id number] 4851w4d weeks presenting with back and vaginal pressure/pain that began at 4p, today. After work she sat in a bath that helped.  Voiding small amounts when she feels she has to go a lot.  Has to return to void soon after.  While collecting urine here, passed a small stone-seen by patient and her RN. Neg for hx of kidney stones.  Neg for vaginal bleeding, fever, chills.  + fetal movement.  At this time sxs are much improved.   Next appt 8/14.  She is a patient of Dr. Emeline DarlingBovard's.   Past Medical History:  Diagnosis Date  . Breech presentation on examination, fetus 1 05/12/2016  . Chlamydia   . History of gonorrhea   . S/P cesarean section 05/13/2016  . SVD (spontaneous vaginal delivery) 12/01/2011  . Vaginal Pap smear, abnormal     Past Surgical History:  Procedure Laterality Date  . CESAREAN SECTION N/A 05/13/2016   Procedure: CESAREAN SECTION;  Surgeon: Sherian ReinJody Bovard-Stuckert, MD;  Location: [facility];  Service: Obstetrics;  Laterality: N/A;  MD requests RNFA- Genice Rougeracey Tucker  . EYE SURGERY     blocked L tear duct  . Surgery for blocked tear duct      Family History  Problem Relation Age of Onset  . Hypertension Mother     Social History   Tobacco Use  . Smoking status: Former Smoker    Types: Cigarettes  . Smokeless tobacco: Never Used  Substance Use Topics  . Alcohol use: No  . Drug use: No    Allergies:  Allergies  Allergen Reactions  . Tetracyclines & Related Other (See Comments)    Causes acid reflux and pain in her stomach    Medications Prior to Admission  Medication Sig Dispense Refill Last Dose  . diphenhydrAMINE (BENADRYL) 25 MG tablet Take 25 mg by mouth at bedtime as needed (for stufy nose).   Past Week at Unknown time  . Prenatal Vit-Fe Fumarate-FA (PRENATAL  MULTIVITAMIN) TABS tablet Take 1 tablet by mouth daily at 12 noon.   07/10/2017 at Unknown time    Review of Systems  Constitutional: Negative for chills and fever.  Gastrointestinal: Negative for abdominal pain.  Genitourinary: Positive for dysuria and flank pain (right). Negative for vaginal bleeding. Difficulty urinating: voiding small amounts.  Musculoskeletal: Positive for back pain.  Psychiatric/Behavioral: The patient is not nervous/anxious.    Physical Exam   Blood pressure 122/69, pulse (!) 109, temperature 97.9 F (36.6 C), resp. rate 18, height 5\' 2"  (1.575 m), weight 166 lb (75.3 kg), last menstrual period 04/28/2017, SpO2 100 %, unknown if currently breastfeeding.  Physical Exam  Nursing note and vitals reviewed. Constitutional: She is oriented to person, place, and time. She appears well-developed and well-nourished. No distress.  HENT:  Head: Normocephalic.  GI: There is no tenderness. There is no rebound, no guarding and no CVA tenderness.  Genitourinary: There is no rash, tenderness or lesion on the right labia. There is no rash, tenderness or lesion on the left labia. Uterus is enlarged. Uterus is not tender. No erythema or bleeding in the vagina. No vaginal discharge (small amount of white normal appearing discharge) found.  Genitourinary Comments: Cervix is closed.   Neurological: She is alert and  oriented to person, place, and time.  Skin: Skin is warm and dry.  Psychiatric: She has a normal mood and affect. Her behavior is normal. Thought content normal.   Results for orders placed or performed during the hospital encounter of 12/07/17 (from the past 24 hour(s))  Urinalysis, Routine w reflex microscopic     Status: Abnormal   Collection Time: 12/07/17  8:39 PM  Result Value Ref Range   Color, Urine YELLOW YELLOW   APPearance CLEAR CLEAR   Specific Gravity, Urine 1.012 1.005 - 1.030   pH 6.0 5.0 - 8.0   Glucose, UA NEGATIVE NEGATIVE mg/dL   Hgb urine dipstick  MODERATE (A) NEGATIVE   Bilirubin Urine NEGATIVE NEGATIVE   Ketones, ur NEGATIVE NEGATIVE mg/dL   Protein, ur NEGATIVE NEGATIVE mg/dL   Nitrite NEGATIVE NEGATIVE   Leukocytes, UA NEGATIVE NEGATIVE   RBC / HPF 21-50 0 - 5 RBC/hpf   WBC, UA 6-10 0 - 5 WBC/hpf   Bacteria, UA NONE SEEN NONE SEEN   Squamous Epithelial / LPF 0-5 0 - 5   Mucus PRESENT    MAU Course  Procedures urine culture to lab  MDM MSE Labs Exam NST  Consulted with Dr. Meisinger-order given for urine culture.     Assessment and Plan  A:  Back pain      Vaginal pressure      [redacted]w[redacted]d gestation      Passed what appears to be a kidney stone  P: Discharge to home     Increase po fluids     Call for worsening sxs     Keep scheduled OB appt.    Dennison Mascot Madison Richmond 12/07/2017, 8:43 PM

## 2017-12-07 NOTE — Discharge Instructions (Signed)
Flank Pain, Adult Flank pain is pain that is located on the side of the body between the upper abdomen and the back. This area is called the flank. The pain may occur over a short period of time (acute), or it may be long-term or recurring (chronic). It may be mild or severe. Flank pain can be caused by many things, including:  Muscle soreness or injury.  Kidney stones or kidney disease.  Stress.  A disease of the spine (vertebral disk disease).  A lung infection (pneumonia).  Fluid around the lungs (pulmonary edema).  A skin rash caused by the chickenpox virus (shingles).  Tumors that affect the back of the abdomen.  Gallbladder disease.  Follow these instructions at home:  Drink enough fluid to keep your urine clear or pale yellow.  Rest as told by your health care provider.  Take over-the-counter and prescription medicines only as told by your health care provider.  Keep a journal to track what has caused your flank pain and what has made it feel better.  Keep all follow-up visits as told by your health care provider. This is important. Contact a health care provider if:  Your pain is not controlled with medicine.  You have new symptoms.  Your pain gets worse.  You have a fever.  Your symptoms last longer than 2-3 days.  You have trouble urinating or you are urinating very frequently. Get help right away if:  You have trouble breathing or you are short of breath.  Your abdomen hurts or it is swollen or red.  You have nausea or vomiting.  You feel faint or you pass out.  You have blood in your urine. Summary  Flank pain is pain that is located on the side of the body between the upper abdomen and the back.  The pain may occur over a short period of time (acute), or it may be long-term or recurring (chronic). It may be mild or severe.  Flank pain can be caused by many things.  Contact your health care provider if your symptoms get worse or they last  longer than 2-3 days. This information is not intended to replace advice given to you by your health care provider. Make sure you discuss any questions you have with your health care provider. Document Released: 06/12/2005 Document Revised: 07/04/2016 Document Reviewed: 07/04/2016 Elsevier Interactive Patient Education  2018 [employer]  

## 2017-12-09 LAB — CULTURE, OB URINE: Culture: NO GROWTH

## 2017-12-17 ENCOUNTER — Inpatient Hospital Stay (HOSPITAL_COMMUNITY)
Admission: AD | Admit: 2017-12-17 | Discharge: 2017-12-17 | Disposition: A | Discharge status: Home / Self Care | Payer: BLUE CROSS/BLUE SHIELD | Source: Ambulatory Visit | Attending: Obstetrics and Gynecology | Admitting: Obstetrics and Gynecology

## 2017-12-17 ENCOUNTER — Encounter (HOSPITAL_COMMUNITY): Payer: Self-pay

## 2017-12-17 ENCOUNTER — Other Ambulatory Visit: Payer: Self-pay

## 2017-12-17 DIAGNOSIS — O26893 Other specified pregnancy related conditions, third trimester: Secondary | ICD-10-CM | POA: Insufficient documentation

## 2017-12-17 DIAGNOSIS — Z87891 Personal history of nicotine dependence: Secondary | ICD-10-CM | POA: Insufficient documentation

## 2017-12-17 DIAGNOSIS — Z3A31 31 weeks gestation of pregnancy: Secondary | ICD-10-CM | POA: Insufficient documentation

## 2017-12-17 DIAGNOSIS — Y92414 Local residential or business street as the place of occurrence of the external cause: Secondary | ICD-10-CM | POA: Diagnosis not present

## 2017-12-17 DIAGNOSIS — O9A219 Injury, poisoning and certain other consequences of external causes complicating pregnancy, unspecified trimester: Secondary | ICD-10-CM

## 2017-12-17 LAB — URINALYSIS, ROUTINE W REFLEX MICROSCOPIC
BILIRUBIN URINE: NEGATIVE
GLUCOSE, UA: NEGATIVE mg/dL
KETONES UR: NEGATIVE mg/dL
LEUKOCYTES UA: NEGATIVE
Nitrite: NEGATIVE
Protein, ur: NEGATIVE mg/dL
Specific Gravity, Urine: 1.014 (ref 1.005–1.030)
pH: 7 (ref 5.0–8.0)
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
## 2018-01-22 LAB — OB RESULTS CONSOLE GBS: STREP GROUP B AG: NEGATIVE

## 2018-01-29 ENCOUNTER — Telehealth (HOSPITAL_COMMUNITY): Payer: Self-pay | Admitting: *Deleted

## 2018-01-29 ENCOUNTER — Encounter (HOSPITAL_COMMUNITY): Payer: Self-pay | Admitting: *Deleted

## 2018-01-29 NOTE — Telephone Encounter (Signed)
Preadmission screen  

## 2018-02-02 ENCOUNTER — Telehealth (HOSPITAL_COMMUNITY): Payer: Self-pay | Admitting: *Deleted

## 2018-02-02 NOTE — Telephone Encounter (Signed)
Preadmission screen  

## 2018-02-03 ENCOUNTER — Encounter (HOSPITAL_COMMUNITY): Payer: Self-pay

## 2018-02-05 NOTE — Patient Instructions (Signed)
Madison Richmond  02/05/2018   Your procedure is scheduled on:  02/11/2018  Enter through the Main Entrance of [facility] at 0730 AM.  Pick up the phone at the desk and dial [phone]  Call this number if you have problems the morning of surgery:[phone]  Remember:   Do not eat food:(After Midnight) Desps de medianoche.  Do not drink clear liquids: (After Midnight) Desps de medianoche.  Take these medicines the morning of surgery with A SIP OF WATER: none   Do not wear jewelry, make-up or nail polish.  Do not wear lotions, powders, or perfumes. Do not wear deodorant.  Do not shave 48 hours prior to surgery.  Do not bring valuables to the hospital.  [facility] is not   responsible for any belongings or valuables brought to the hospital.  Contacts, dentures or bridgework may not be worn into surgery.  Leave suitcase in the car. After surgery it may be brought to your room.  For patients admitted to the hospital, checkout time is 11:00 AM the day of              discharge.    N/A   Please read over the following fact sheets that you were given:   Surgical Site Infection Prevention

## 2018-02-09 ENCOUNTER — Encounter (HOSPITAL_COMMUNITY): Payer: Self-pay | Admitting: Obstetrics and Gynecology

## 2018-02-09 ENCOUNTER — Encounter (HOSPITAL_COMMUNITY)
Admission: RE | Admit: 2018-02-09 | Discharge: 2018-02-09 | Disposition: A | Discharge status: Home / Self Care | Payer: BLUE CROSS/BLUE SHIELD | Source: Ambulatory Visit | Attending: Obstetrics and Gynecology | Admitting: Obstetrics and Gynecology

## 2018-02-09 DIAGNOSIS — Z98891 History of uterine scar from previous surgery: Secondary | ICD-10-CM

## 2018-02-09 HISTORY — DX: History of uterine scar from previous surgery: Z98.891

## 2018-02-09 LAB — CBC
HEMATOCRIT: 34 % — AB (ref 36.0–46.0)
HEMOGLOBIN: 10.9 g/dL — AB (ref 12.0–15.0)
MCH: 26 pg (ref 26.0–34.0)
MCHC: 32.1 g/dL (ref 30.0–36.0)
MCV: 81 fL (ref 80.0–100.0)
NRBC: 0 % (ref 0.0–0.2)
Platelets: 340 10*3/uL (ref 150–400)
RBC: 4.2 MIL/uL (ref 3.87–5.11)
RDW: 16.3 % — ABNORMAL HIGH (ref 11.5–15.5)
WBC: 10.9 10*3/uL — AB (ref 4.0–10.5)

## 2018-02-09 LAB — TYPE AND SCREEN
ABO/RH(D): A NEG
ANTIBODY SCREEN: NEGATIVE
Weak D: POSITIVE

## 2018-02-10 ENCOUNTER — Encounter (HOSPITAL_COMMUNITY)
Admission: RE | Admit: 2018-02-10 | Discharge: 2018-02-10 | Disposition: A | Discharge status: Home / Self Care | Payer: BLUE CROSS/BLUE SHIELD | Source: Ambulatory Visit | Attending: Obstetrics and Gynecology | Admitting: Obstetrics and Gynecology

## 2018-02-10 ENCOUNTER — Encounter (HOSPITAL_COMMUNITY): Payer: Self-pay | Admitting: Obstetrics and Gynecology

## 2018-02-10 DIAGNOSIS — Z302 Encounter for sterilization: Secondary | ICD-10-CM

## 2018-02-10 HISTORY — DX: Encounter for sterilization: Z30.2

## 2018-02-10 LAB — RPR: RPR: NONREACTIVE

## 2018-02-10 NOTE — H&P (Signed)
Madison Richmond is a 22 y.o. female G3P2002 at 39+ for rLTCS and BTL.  Relatively uncomplicated PNC.  Dated by 1st trimester [place].  Received Tdap and Flu vaccines.   Elevated glucola, but normal 3 hr GTT.  D/W pt r/b/a of LTCS and BTL.  Pt is also tobacco user.    OB History    Gravida  3   Para  2   Term  2   Preterm  0   AB  0   Living  2     SAB  0   TAB  0   Ectopic  0   Multiple  0   Live Births  2         G1 SVD female 7#6 G2 breech, LTCS female 8#13 G3 present - for rlTCS  + abn pap, last WNL +GC and Chl  Past Medical History:  Diagnosis Date  . Breech presentation on examination, fetus 1 05/12/2016  . Chlamydia   . Encounter for tubal ligation 02/10/2018  . History of cesarean section, low transverse 02/09/2018  . History of gonorrhea   . S/P cesarean section 05/13/2016  . SVD (spontaneous vaginal delivery) 12/01/2011  . Vaginal Pap smear, abnormal    Past Surgical History:  Procedure Laterality Date  . CESAREAN SECTION N/A 05/13/2016   Procedure: CESAREAN SECTION;  Surgeon: Sherian Rein, MD;  Location: [facility];  Service: Obstetrics;  Laterality: N/A;  MD requests RNFA- Genice Rouge  . EYE SURGERY     blocked L tear duct  . Surgery for blocked tear duct     Family History: family history includes Hypertension in her mother.CAD, fibroids, MI, bipolar, RA, COPD, lung CA Social History:  reports that she has quit smoking. Her smoking use included cigarettes. She has never used smokeless tobacco. She reports that she does not drink alcohol or use drugs. single receptionist   Meds PNV, fluticasone All tetracycline      Maternal Diabetes: No Genetic Screening: Normal Maternal Ultrasounds/Referrals: Normal Fetal Ultrasounds or other Referrals:  None Maternal Substance Abuse:  Yes:  Type: Smoker Significant Maternal Medications:  None Significant Maternal Lab Results:  Lab values include: Group B Strep negative Other Comments:   None  Review of Systems  Constitutional: Negative.   HENT: Negative.   Eyes: Negative.   Respiratory: Negative.   Cardiovascular: Negative.   Gastrointestinal: Negative.   Genitourinary: Negative.   Musculoskeletal: Negative.   Skin: Negative.   Neurological: Negative.   Psychiatric/Behavioral: Negative.    Maternal Medical History:  Contractions: Frequency: irregular.    Fetal activity: Perceived fetal activity is normal.    Prenatal Complications - Diabetes: none.      Last menstrual period 04/28/2017, unknown if currently breastfeeding. Maternal Exam:  Uterine Assessment: Contraction frequency is irregular.   Abdomen: Patient reports no abdominal tenderness. Surgical scars: low transverse.   Fundal height is appropriate for gestation.   Estimated fetal weight is 8.5-9#.   Fetal presentation: vertex  Introitus: Normal vulva. Normal vagina.    Physical Exam  Constitutional: She is oriented to person, place, and time. She appears well-developed and well-nourished.  HENT:  Head: Normocephalic and atraumatic.  Cardiovascular: Normal rate and regular rhythm.  Respiratory: Effort normal and breath sounds normal. No respiratory distress. She has no wheezes.  GI: Soft. Bowel sounds are normal.  Musculoskeletal: Normal range of motion.  Neurological: She is alert and oriented to person, place, and time.  Skin: Skin is warm and dry.  Psychiatric: She has a normal mood and affect. Her behavior is normal.    Prenatal labs: ABO, Rh: --/--/A NEG (10/08 0915) Antibody: NEG (10/08 0915) Rubella: Immune (03/27 0000) RPR: Non Reactive (10/08 1015)  HBsAg: Negative (03/27 0000)  HIV: Non-reactive (03/27 0000)  GBS: Negative (09/20 0000)   Hgb 12.3/Plt 288/Ur cx neg/GC neg/Chl neg/Varicella immune/Hgb electro WNL/Pap WNL/First trimester Screen WNL, nl NT/glucola 143/nl 3 hr GTT/essential panel neg  Nl anat, female, post plac  Assessment/Plan: 22yo G3P2002 at 39+ for rLTCS  and BTL D/w pt r/b/a and process Ancef for prophylaxis    Alka Falwell Bovard-Stuckert 02/10/2018, 9:57 PM

## 2018-02-11 ENCOUNTER — Inpatient Hospital Stay (HOSPITAL_COMMUNITY): Payer: BLUE CROSS/BLUE SHIELD | Admitting: Anesthesiology

## 2018-02-11 ENCOUNTER — Inpatient Hospital Stay (HOSPITAL_COMMUNITY)
Admission: AD | Admit: 2018-02-11 | Discharge: 2018-02-13 | DRG: 785 | Disposition: A | Discharge status: Home / Self Care | Payer: BLUE CROSS/BLUE SHIELD | Attending: Obstetrics and Gynecology | Admitting: Obstetrics and Gynecology

## 2018-02-11 ENCOUNTER — Encounter (HOSPITAL_COMMUNITY)
Admission: AD | Disposition: A | Discharge status: Home / Self Care | Payer: Self-pay | Source: Home / Self Care | Attending: Obstetrics and Gynecology

## 2018-02-11 ENCOUNTER — Encounter (HOSPITAL_COMMUNITY): Payer: Self-pay | Admitting: Anesthesiology

## 2018-02-11 DIAGNOSIS — O34211 Maternal care for low transverse scar from previous cesarean delivery: Principal | ICD-10-CM | POA: Diagnosis present

## 2018-02-11 DIAGNOSIS — Z98891 History of uterine scar from previous surgery: Secondary | ICD-10-CM

## 2018-02-11 DIAGNOSIS — Z87891 Personal history of nicotine dependence: Secondary | ICD-10-CM | POA: Diagnosis not present

## 2018-02-11 DIAGNOSIS — Z3A39 39 weeks gestation of pregnancy: Secondary | ICD-10-CM | POA: Diagnosis not present

## 2018-02-11 DIAGNOSIS — Z302 Encounter for sterilization: Secondary | ICD-10-CM

## 2018-02-11 HISTORY — DX: History of uterine scar from previous surgery: Z98.891

## 2018-02-11 HISTORY — DX: Encounter for sterilization: Z30.2

## 2018-02-11 SURGERY — Surgical Case
Anesthesia: Spinal | Site: Abdomen | Laterality: Bilateral | Wound class: Clean Contaminated

## 2018-02-11 MED ORDER — LACTATED RINGERS IV SOLN
INTRAVENOUS | Status: DC | PRN
  Administered 2018-02-11: 10:00:00 via INTRAVENOUS

## 2018-02-11 MED ORDER — FENTANYL CITRATE (PF) 100 MCG/2ML IJ SOLN
INTRAMUSCULAR | Status: AC
  Filled 2018-02-11: qty 2

## 2018-02-11 MED ORDER — SIMETHICONE 80 MG PO CHEW
80.0000 mg | CHEWABLE_TABLET | ORAL | Status: DC
  Administered 2018-02-12 (×2): 80 mg via ORAL
  Filled 2018-02-11 (×2): qty 1

## 2018-02-11 MED ORDER — OXYCODONE HCL 5 MG PO TABS: 10.0000 mg | ORAL_TABLET | ORAL | Status: DC | PRN

## 2018-02-11 MED ORDER — SODIUM CHLORIDE 0.9 % IR SOLN
Status: DC | PRN
  Administered 2018-02-11: 1000 mL

## 2018-02-11 MED ORDER — SENNOSIDES-DOCUSATE SODIUM 8.6-50 MG PO TABS
2.0000 | ORAL_TABLET | ORAL | Status: DC
  Administered 2018-02-12 (×2): 2 via ORAL
  Filled 2018-02-11 (×2): qty 2

## 2018-02-11 MED ORDER — PHENYLEPHRINE 8 MG IN D5W 100 ML (0.08MG/ML) PREMIX OPTIME
INJECTION | INTRAVENOUS | Status: AC
  Filled 2018-02-11: qty 100

## 2018-02-11 MED ORDER — SOD CITRATE-CITRIC ACID 500-334 MG/5ML PO SOLN
ORAL | Status: AC
  Administered 2018-02-11: 30 mL
  Filled 2018-02-11: qty 15

## 2018-02-11 MED ORDER — OXYCODONE HCL 5 MG PO TABS
5.0000 mg | ORAL_TABLET | ORAL | Status: DC | PRN
  Administered 2018-02-12: 5 mg via ORAL
  Filled 2018-02-11: qty 1

## 2018-02-11 MED ORDER — NALBUPHINE HCL 10 MG/ML IJ SOLN
5.0000 mg | Freq: Once | INTRAMUSCULAR | Status: AC | PRN
  Administered 2018-02-11: 5 mg via SUBCUTANEOUS

## 2018-02-11 MED ORDER — BUPIVACAINE IN DEXTROSE 0.75-8.25 % IT SOLN
INTRATHECAL | Status: DC | PRN
  Administered 2018-02-11: 1.6 mL via INTRATHECAL

## 2018-02-11 MED ORDER — FENTANYL CITRATE (PF) 100 MCG/2ML IJ SOLN: 25.0000 ug | INTRAMUSCULAR | Status: DC | PRN

## 2018-02-11 MED ORDER — NALOXONE HCL 0.4 MG/ML IJ SOLN: 0.4000 mg | INTRAMUSCULAR | Status: DC | PRN

## 2018-02-11 MED ORDER — NALBUPHINE HCL 10 MG/ML IJ SOLN: 5.0000 mg | INTRAMUSCULAR | Status: DC | PRN

## 2018-02-11 MED ORDER — PRENATAL MULTIVITAMIN CH
1.0000 | ORAL_TABLET | Freq: Every day | ORAL | Status: DC
  Administered 2018-02-12 – 2018-02-13 (×2): 1 via ORAL
  Filled 2018-02-11 (×2): qty 1

## 2018-02-11 MED ORDER — MEPERIDINE HCL 25 MG/ML IJ SOLN
INTRAMUSCULAR | Status: AC
  Filled 2018-02-11: qty 1

## 2018-02-11 MED ORDER — CALCIUM CARBONATE ANTACID 500 MG PO CHEW
750.0000 mg | CHEWABLE_TABLET | Freq: Four times a day (QID) | ORAL | Status: DC | PRN
  Administered 2018-02-12: 750 mg via ORAL
  Filled 2018-02-11: qty 4

## 2018-02-11 MED ORDER — MENTHOL 3 MG MT LOZG: 1.0000 | LOZENGE | OROMUCOSAL | Status: DC | PRN

## 2018-02-11 MED ORDER — PHENYLEPHRINE 40 MCG/ML (10ML) SYRINGE FOR IV PUSH (FOR BLOOD PRESSURE SUPPORT)
PREFILLED_SYRINGE | INTRAVENOUS | Status: AC
  Filled 2018-02-11: qty 10

## 2018-02-11 MED ORDER — NALBUPHINE HCL 10 MG/ML IJ SOLN
INTRAMUSCULAR | Status: AC
  Filled 2018-02-11: qty 1

## 2018-02-11 MED ORDER — ACETAMINOPHEN 325 MG PO TABS
650.0000 mg | ORAL_TABLET | ORAL | Status: DC | PRN
  Administered 2018-02-12 – 2018-02-13 (×3): 650 mg via ORAL
  Filled 2018-02-11 (×3): qty 2

## 2018-02-11 MED ORDER — ZOLPIDEM TARTRATE 5 MG PO TABS: 5.0000 mg | ORAL_TABLET | Freq: Every evening | ORAL | Status: DC | PRN

## 2018-02-11 MED ORDER — SCOPOLAMINE 1 MG/3DAYS TD PT72
MEDICATED_PATCH | TRANSDERMAL | Status: AC
  Filled 2018-02-11: qty 1

## 2018-02-11 MED ORDER — ONDANSETRON HCL 4 MG/2ML IJ SOLN
INTRAMUSCULAR | Status: DC | PRN
  Administered 2018-02-11: 4 mg via INTRAVENOUS

## 2018-02-11 MED ORDER — FENTANYL CITRATE (PF) 100 MCG/2ML IJ SOLN
INTRAMUSCULAR | Status: DC | PRN
  Administered 2018-02-11: 15 ug via INTRAVENOUS

## 2018-02-11 MED ORDER — LACTATED RINGERS IV SOLN
INTRAVENOUS | Status: DC
  Administered 2018-02-11: 19:00:00 via INTRAVENOUS

## 2018-02-11 MED ORDER — IBUPROFEN 800 MG PO TABS
800.0000 mg | ORAL_TABLET | Freq: Three times a day (TID) | ORAL | Status: DC
  Administered 2018-02-11 – 2018-02-13 (×6): 800 mg via ORAL
  Filled 2018-02-11 (×6): qty 1

## 2018-02-11 MED ORDER — SODIUM CHLORIDE 0.9% FLUSH: 3.0000 mL | INTRAVENOUS | Status: DC | PRN

## 2018-02-11 MED ORDER — OXYTOCIN 40 UNITS IN LACTATED RINGERS INFUSION - SIMPLE MED: 2.5000 [IU]/h | INTRAVENOUS | Status: AC

## 2018-02-11 MED ORDER — OXYCODONE HCL 5 MG/5ML PO SOLN: 5.0000 mg | Freq: Once | ORAL | Status: DC | PRN

## 2018-02-11 MED ORDER — PHENYLEPHRINE 8 MG IN D5W 100 ML (0.08MG/ML) PREMIX OPTIME
INJECTION | INTRAVENOUS | Status: DC | PRN
  Administered 2018-02-11: 60 ug/min via INTRAVENOUS

## 2018-02-11 MED ORDER — CALCIUM CARBONATE ANTACID 750 MG PO CHEW
1.0000 | CHEWABLE_TABLET | Freq: Four times a day (QID) | ORAL | Status: DC | PRN
  Filled 2018-02-11: qty 1

## 2018-02-11 MED ORDER — PHENYLEPHRINE HCL 10 MG/ML IJ SOLN
INTRAMUSCULAR | Status: DC | PRN
  Administered 2018-02-11 (×2): 120 ug via INTRAVENOUS
  Administered 2018-02-11: 40 ug via INTRAVENOUS

## 2018-02-11 MED ORDER — METOCLOPRAMIDE HCL 5 MG/ML IJ SOLN
INTRAMUSCULAR | Status: DC | PRN
  Administered 2018-02-11: 10 mg via INTRAVENOUS

## 2018-02-11 MED ORDER — DIPHENHYDRAMINE HCL 25 MG PO CAPS
25.0000 mg | ORAL_CAPSULE | ORAL | Status: DC | PRN
  Filled 2018-02-11: qty 1

## 2018-02-11 MED ORDER — MORPHINE SULFATE (PF) 0.5 MG/ML IJ SOLN
INTRAMUSCULAR | Status: DC | PRN
  Administered 2018-02-11: 15 ug via INTRATHECAL

## 2018-02-11 MED ORDER — CEFAZOLIN SODIUM-DEXTROSE 2-4 GM/100ML-% IV SOLN
2.0000 g | INTRAVENOUS | Status: AC
  Administered 2018-02-11: 2 g via INTRAVENOUS
  Filled 2018-02-11: qty 100

## 2018-02-11 MED ORDER — NALOXONE HCL 4 MG/10ML IJ SOLN
1.0000 ug/kg/h | INTRAVENOUS | Status: DC | PRN
  Filled 2018-02-11: qty 5

## 2018-02-11 MED ORDER — DEXAMETHASONE SODIUM PHOSPHATE 4 MG/ML IJ SOLN
INTRAMUSCULAR | Status: AC
  Filled 2018-02-11: qty 1

## 2018-02-11 MED ORDER — LACTATED RINGERS IV SOLN
INTRAVENOUS | Status: DC
  Administered 2018-02-11 (×3): via INTRAVENOUS

## 2018-02-11 MED ORDER — SIMETHICONE 80 MG PO CHEW: 80.0000 mg | CHEWABLE_TABLET | ORAL | Status: DC | PRN

## 2018-02-11 MED ORDER — DIPHENHYDRAMINE HCL 50 MG/ML IJ SOLN
12.5000 mg | INTRAMUSCULAR | Status: DC | PRN
  Administered 2018-02-11: 12.5 mg via INTRAVENOUS

## 2018-02-11 MED ORDER — OXYTOCIN 10 UNIT/ML IJ SOLN
INTRAVENOUS | Status: DC | PRN
  Administered 2018-02-11: 40 [IU] via INTRAVENOUS

## 2018-02-11 MED ORDER — DIPHENHYDRAMINE HCL 50 MG/ML IJ SOLN
INTRAMUSCULAR | Status: AC
  Filled 2018-02-11: qty 1

## 2018-02-11 MED ORDER — SCOPOLAMINE 1 MG/3DAYS TD PT72
MEDICATED_PATCH | TRANSDERMAL | Status: DC | PRN
  Administered 2018-02-11: 1 via TRANSDERMAL

## 2018-02-11 MED ORDER — DIBUCAINE 1 % RE OINT: 1.0000 "application " | TOPICAL_OINTMENT | RECTAL | Status: DC | PRN

## 2018-02-11 MED ORDER — MEPERIDINE HCL 25 MG/ML IJ SOLN: 6.2500 mg | INTRAMUSCULAR | Status: DC | PRN

## 2018-02-11 MED ORDER — COCONUT OIL OIL: 1.0000 "application " | TOPICAL_OIL | Status: DC | PRN

## 2018-02-11 MED ORDER — SIMETHICONE 80 MG PO CHEW
80.0000 mg | CHEWABLE_TABLET | Freq: Three times a day (TID) | ORAL | Status: DC
  Administered 2018-02-11 – 2018-02-13 (×6): 80 mg via ORAL
  Filled 2018-02-11 (×6): qty 1

## 2018-02-11 MED ORDER — ONDANSETRON HCL 4 MG/2ML IJ SOLN: 4.0000 mg | Freq: Three times a day (TID) | INTRAMUSCULAR | Status: DC | PRN

## 2018-02-11 MED ORDER — ONDANSETRON HCL 4 MG/2ML IJ SOLN
INTRAMUSCULAR | Status: AC
  Filled 2018-02-11: qty 2

## 2018-02-11 MED ORDER — NALBUPHINE HCL 10 MG/ML IJ SOLN: 5.0000 mg | Freq: Once | INTRAMUSCULAR | Status: AC | PRN

## 2018-02-11 MED ORDER — SCOPOLAMINE 1 MG/3DAYS TD PT72
1.0000 | MEDICATED_PATCH | Freq: Once | TRANSDERMAL | Status: AC
  Administered 2018-02-11: 1 via TRANSDERMAL

## 2018-02-11 MED ORDER — DEXAMETHASONE SODIUM PHOSPHATE 4 MG/ML IJ SOLN
INTRAMUSCULAR | Status: DC | PRN
  Administered 2018-02-11: 4 mg via INTRAVENOUS

## 2018-02-11 MED ORDER — ONDANSETRON HCL 4 MG/2ML IJ SOLN: 4.0000 mg | Freq: Once | INTRAMUSCULAR | Status: DC | PRN

## 2018-02-11 MED ORDER — OXYTOCIN 10 UNIT/ML IJ SOLN
INTRAMUSCULAR | Status: AC
  Filled 2018-02-11: qty 4

## 2018-02-11 MED ORDER — WITCH HAZEL-GLYCERIN EX PADS: 1.0000 "application " | MEDICATED_PAD | CUTANEOUS | Status: DC | PRN

## 2018-02-11 MED ORDER — MEPERIDINE HCL 25 MG/ML IJ SOLN
INTRAMUSCULAR | Status: DC | PRN
  Administered 2018-02-11 (×2): 12.5 mg via INTRAVENOUS

## 2018-02-11 MED ORDER — DIPHENHYDRAMINE HCL 25 MG PO CAPS: 25.0000 mg | ORAL_CAPSULE | Freq: Four times a day (QID) | ORAL | Status: DC | PRN

## 2018-02-11 MED ORDER — OXYCODONE HCL 5 MG PO TABS: 5.0000 mg | ORAL_TABLET | Freq: Once | ORAL | Status: DC | PRN

## 2018-02-11 MED ORDER — MORPHINE SULFATE (PF) 0.5 MG/ML IJ SOLN
INTRAMUSCULAR | Status: AC
  Filled 2018-02-11: qty 10

## 2018-02-11 SURGICAL SUPPLY — 38 items
APL SKNCLS STERI-STRIP NONHPOA (GAUZE/BANDAGES/DRESSINGS) ×1
BENZOIN TINCTURE PRP APPL 2/3 (GAUZE/BANDAGES/DRESSINGS) ×3 IMPLANT
CHLORAPREP W/TINT 26ML (MISCELLANEOUS) ×3 IMPLANT
CLAMP CORD UMBIL (MISCELLANEOUS) IMPLANT
CLOSURE STERI STRIP 1/2 X4 (GAUZE/BANDAGES/DRESSINGS) ×2 IMPLANT
CLOTH BEACON ORANGE TIMEOUT ST (SAFETY) ×3 IMPLANT
DRSG OPSITE POSTOP 4X10 (GAUZE/BANDAGES/DRESSINGS) ×3 IMPLANT
ELECT REM PT RETURN 9FT ADLT (ELECTROSURGICAL) ×3
ELECTRODE REM PT RTRN 9FT ADLT (ELECTROSURGICAL) ×1 IMPLANT
EXTRACTOR VACUUM M CUP 4 TUBE (SUCTIONS) IMPLANT
EXTRACTOR VACUUM M CUP 4' TUBE (SUCTIONS)
GLOVE BIO SURGEON STRL SZ 6.5 (GLOVE) ×2 IMPLANT
GLOVE BIO SURGEONS STRL SZ 6.5 (GLOVE) ×1
GLOVE BIOGEL PI IND STRL 7.0 (GLOVE) ×1 IMPLANT
GLOVE BIOGEL PI INDICATOR 7.0 (GLOVE) ×2
GOWN STRL REUS W/TWL LRG LVL3 (GOWN DISPOSABLE) ×6 IMPLANT
KIT ABG SYR 3ML LUER SLIP (SYRINGE) IMPLANT
NDL HYPO 25X5/8 SAFETYGLIDE (NEEDLE) IMPLANT
NEEDLE HYPO 25X5/8 SAFETYGLIDE (NEEDLE) IMPLANT
NS IRRIG 1000ML POUR BTL (IV SOLUTION) ×3 IMPLANT
PACK C SECTION WH (CUSTOM PROCEDURE TRAY) ×3 IMPLANT
PAD OB MATERNITY 4.3X12.25 (PERSONAL CARE ITEMS) ×3 IMPLANT
PENCIL SMOKE EVAC W/HOLSTER (ELECTROSURGICAL) ×3 IMPLANT
RTRCTR C-SECT PINK 25CM LRG (MISCELLANEOUS) ×3 IMPLANT
SPONGE LAP 18X18 RF (DISPOSABLE) ×9 IMPLANT
STRIP CLOSURE SKIN 1/2X4 (GAUZE/BANDAGES/DRESSINGS) ×2 IMPLANT
SUT MNCRL 0 VIOLET CTX 36 (SUTURE) ×2 IMPLANT
SUT MONOCRYL 0 CTX 36 (SUTURE) ×4
SUT PLAIN 1 NONE 54 (SUTURE) IMPLANT
SUT PLAIN 2 0 XLH (SUTURE) ×3 IMPLANT
SUT VIC AB 0 CT1 27 (SUTURE) ×6
SUT VIC AB 0 CT1 27XBRD ANBCTR (SUTURE) ×2 IMPLANT
SUT VIC AB 2-0 CT1 27 (SUTURE) ×3
SUT VIC AB 2-0 CT1 TAPERPNT 27 (SUTURE) ×1 IMPLANT
SUT VIC AB 4-0 KS 27 (SUTURE) ×3 IMPLANT
SYR BULB IRRIGATION 50ML (SYRINGE) ×3 IMPLANT
TOWEL OR 17X24 6PK STRL BLUE (TOWEL DISPOSABLE) ×3 IMPLANT
TRAY FOLEY W/BAG SLVR 14FR LF (SET/KITS/TRAYS/PACK) IMPLANT

## 2018-02-11 NOTE — Brief Op Note (Signed)
02/11/2018  11:06 AM  PATIENT:  Gladstone Lighter  22 y.o. female  PRE-OPERATIVE DIAGNOSIS:  repeat cesarean section and undesired fertility  POST-OPERATIVE DIAGNOSIS:  repeat cesarean section and undesired fertility, poss Acreta  PROCEDURE:  Procedure(s) with comments: REPEAT CESAREAN SECTION WITH BILATERAL TUBAL LIGATION (Bilateral) - [name],  RNFA  SURGEON:  Surgeon(s) and Role:    * Bovard-Stuckert, Travon Crochet, MD - Primary  ASSISTANTS: Medical laboratory scientific officer, [name] RNFA   ANESTHESIA:   spinal  EBL:  317 mL IVF and uop per anesthesia - clear urine in case per anesthesia  FINDINGS: viable female infant at 10:10 apgars 9/9, wt P; nl PP uterus, poss accreta; nl tubes and ovaries  BLOOD ADMINISTERED:none  DRAINS: Urinary Catheter (Foley)   LOCAL MEDICATIONS USED:  NONE  SPECIMEN:  Source of Specimen:  Placenta, B tubal segments  DISPOSITION OF SPECIMEN:  PATHOLOGY  COUNTS:  YES  TOURNIQUET:  * No tourniquets in log *  DICTATION: .Other Dictation: Dictation Number [id number]  PLAN OF CARE: Admit to inpatient   PATIENT DISPOSITION:  PACU - hemodynamically stable.   Delay start of Pharmacological VTE agent (>24hrs) due to surgical blood loss or risk of bleeding: not applicable

## 2018-02-11 NOTE — Addendum Note (Signed)
Addendum  created 02/11/18 1632 by Orlie Pollen, CRNA   Sign clinical note

## 2018-02-11 NOTE — Anesthesia Postprocedure Evaluation (Signed)
Anesthesia Post Note  Patient: Madison Richmond  Procedure(s) Performed: REPEAT CESAREAN SECTION WITH BILATERAL TUBAL LIGATION (Bilateral Abdomen)     Patient location during evaluation: Mother Baby Anesthesia Type: Spinal Level of consciousness: oriented and awake and alert Pain management: pain level controlled Vital Signs Assessment: post-procedure vital signs reviewed and stable Respiratory status: spontaneous breathing, respiratory function stable and patient connected to nasal cannula oxygen Cardiovascular status: blood pressure returned to baseline and stable Postop Assessment: no headache, no backache, no apparent nausea or vomiting, spinal receding, patient able to bend at knees and adequate PO intake Anesthetic complications: no    Last Vitals:  Vitals:   02/11/18 1315 02/11/18 1420  BP: (!) 109/55 108/69  Pulse: 88 91  Resp: 18 18  Temp: 36.5 C 36.9 C  SpO2: 97% 95%    Last Pain:  Vitals:   02/11/18 1420  TempSrc: Oral  PainSc: 3    Pain Goal:                 [employer]

## 2018-02-11 NOTE — Op Note (Signed)
NAME: Madison Richmond, Madison Richmond MEDICAL RECORD ZO:[id number] ACCOUNT [id number] DATE OF BIRTH:[birth year] FACILITY: [facility] LOCATION: [id number] PHYSICIAN:Zamire Whitehurst BOVARD-STUCKERT, MD  OPERATIVE REPORT  DATE OF PROCEDURE:  02/11/2018  PREOPERATIVE DIAGNOSES:  Intrauterine pregnancy at term, history of low transverse cesarean section, desires repeat section, undesired fertility.  POSTOPERATIVE DIAGNOSES:  Intrauterine pregnancy at term, history of low transverse cesarean section, desires repeat section, undesired fertility,  possible accreta, delivered.  PROCEDURE:  Repeat low transverse cesarean section with bilateral tubal ligation.  SURGEON:  Sherian Rein, MD  ASSISTANT:  Webb Silversmith, RNFA   ANESTHESIA:  Spinal.  IV FLUIDS AND URINE OUTPUT:  Per anesthesia.  URINE OUTPUT:  Clear at the end of the case per anesthesia.  ESTIMATED BLOOD LOSS:  Approximately 317 mL.  FINDINGS:  Viable female infant at 10:10 a.m. with Apgars of 9 at 1 minute and 9 at 5 minutes and a weight pending at the time of dictation.  Normal postpartum uterus, possible accreta noted at the time of placental extraction.  Normal tubes and ovaries.  COMPLICATIONS:  Possible accreta.  PATHOLOGY:  Bilateral tubal segments and placenta to pathology.  DESCRIPTION OF PROCEDURE:  After informed consent was reviewed with the patient and her family including risks, benefits and alternatives of both surgical procedures, she was transported to the operating room where spinal anesthesia was placed and the  level was found to be adequate.  She was then returned to the supine position with a leftward tilt, prepped and draped in the normal sterile fashion.  Appropriate timeout was performed.  Following this timeout, a Pfannenstiel skin incision was made at  the level of her previous incision and carried through to the underlying layer of fascia sharply.  The fascia was incised in the midline, and the incision was extended  laterally with Mayo scissors.  The superior aspect of the fascial incision was grasped  with Kocher clamps, elevated and the rectus muscles were dissected off both bluntly and sharply.  Midline was easily identified.  Peritoneum was entered bluntly.  This incision was extended superiorly and inferiorly with good visualization of the  bladder.  Alexis skin retractor was placed carefully, making sure that no bowel was entrapped.  The uterus was explored.  A bladder flap was created both digitally and sharply.  The uterus was incised in a transverse fashion, and the infant was delivered  from a vertex presentation with the aid of a vacuum.  The nose and mouth were suctioned.  The cord was clamped and cut.  After a minute of waiting, the infant was then handed off to the waiting pediatric staff.  The placenta was attempted to be  extracted.  It would not deliver.  Manual extraction ensued, and there was some difficulty separating the placenta from the uterine tissue, likely accreta.  The interior of the uterus was cleared of all clot and debris, and the placenta seemed to be  completely extracted.  The incision was closed with 2 layers of 0 Monocryl, the first of which was running locked and the second as an imbricating layer.  The incision was noted to be hemostatic.  Following delivery of the placenta, the uterus had been  inverted.  It was returned to normal configuration with steady pressure.  Following this, the uterus was cleared with of all clot and debris and noted to be hemostatic.  The tubes were both identified and followed to their fimbriated end, elevated with a  Babcock, and a window was created in the mesosalpinx with Bovie  cautery.  A modified [place] tubal ligation was then performed.  The intervening portion was sent to pathology.  The abdomen was noted to be hemostatic.  The uterus was returned to the abdomen.   The gutters were cleared of all clot and debris.  The uterine incision was noted  to be hemostatic and the sutures were cut.  The peritoneum was reapproximated with 2-0 Vicryl in a running fashion.  The subfascial planes were inspected and found to be  hemostatic.  The fascia was then closed from either end, overlapping in the midline with 0 Vicryl.  The subcuticular adipose layer was made hemostatic with Bovie cautery, and the dead space was closed with interrupted sutures of plain gut.  The skin was  reapproximated with 4-0 Vicryl on a Keith needle.  Benzoin and Steri-Strips were applied.  The patient tolerated the procedure well.  Sponge, lap and needle count was correct x2 per the operating room staff.  LN/NUANCE  D:02/11/2018 T:02/11/2018 JOB:[id number]

## 2018-02-11 NOTE — Interval H&P Note (Signed)
History and Physical Interval Note:  02/11/2018 9:19 AM  Madison Richmond  has presented today for surgery, with the diagnosis of repeat c-section and sterilization  The various methods of treatment have been discussed with the patient and family. After consideration of risks, benefits and other options for treatment, the patient has consented to  Procedure(s) with comments: REPEAT CESAREAN SECTION WITH BILATERAL TUBAL LIGATION (Bilateral) - [name],  RNFA as a surgical intervention .  The patient's history has been reviewed, patient examined, no change in status, stable for surgery.  I have reviewed the patient's chart and labs.  Questions were answered to the patient's satisfaction.     Lavone Barrientes Bovard-Stuckert

## 2018-02-11 NOTE — Transfer of Care (Signed)
Immediate Anesthesia Transfer of Care Note  Patient: Madison Richmond  Procedure(s) Performed: REPEAT CESAREAN SECTION WITH BILATERAL TUBAL LIGATION (Bilateral Abdomen)  Patient Location: PACU  Anesthesia Type:Spinal  Level of Consciousness: awake, alert  and oriented  Airway & Oxygen Therapy: Patient Spontanous Breathing  Post-op Assessment: Report given to RN and Post -op Vital signs reviewed and stable  Post vital signs: Reviewed and stable  Last Vitals:  Vitals Value Taken Time  BP 110/55 02/11/2018 11:03 AM  Temp    Pulse 95 02/11/2018 11:05 AM  Resp 19 02/11/2018 11:05 AM  SpO2 98 % 02/11/2018 11:05 AM  Vitals shown include unvalidated device data.  Last Pain:  Vitals:   02/11/18 0821  TempSrc:   PainSc: 0-No pain         Complications: No apparent anesthesia complications

## 2018-02-11 NOTE — Anesthesia Postprocedure Evaluation (Signed)
Anesthesia Post Note  Patient: Madison Richmond  Procedure(s) Performed: REPEAT CESAREAN SECTION WITH BILATERAL TUBAL LIGATION (Bilateral Abdomen)     Patient location during evaluation: PACU Anesthesia Type: Spinal Level of consciousness: oriented and awake and alert Pain management: pain level controlled Vital Signs Assessment: post-procedure vital signs reviewed and stable Respiratory status: spontaneous breathing and respiratory function stable Cardiovascular status: blood pressure returned to baseline and stable Postop Assessment: no headache, no backache, no apparent nausea or vomiting and spinal receding Anesthetic complications: no    Last Vitals:  Vitals:   02/11/18 1144 02/11/18 1145  BP:  120/90  Pulse: 84 93  Resp: (!) 28 18  Temp:    SpO2: 99% 99%    Last Pain:  Vitals:   02/11/18 1104  TempSrc:   PainSc: 0-No pain   Pain Goal:                 Lucretia Kern

## 2018-02-11 NOTE — Anesthesia Procedure Notes (Signed)
Spinal  Patient location during procedure: OR Staffing Anesthesiologist: Anjani Feuerborn E, MD Performed: anesthesiologist  Preanesthetic Checklist Completed: patient identified, surgical consent, pre-op evaluation, timeout performed, IV checked, risks and benefits discussed and monitors and equipment checked Spinal Block Patient position: sitting Prep: site prepped and draped and DuraPrep Patient monitoring: continuous pulse ox, blood pressure and heart rate Approach: midline Location: L3-4 Injection technique: single-shot Needle Needle type: Pencan  Needle gauge: 24 G Needle length: 9 cm Additional Notes Functioning IV was confirmed and monitors were applied. Sterile prep and drape, including hand hygiene and sterile gloves were used. The patient was positioned and the spine was prepped. The skin was anesthetized with lidocaine.  Free flow of clear CSF was obtained prior to injecting local anesthetic into the CSF. The needle was carefully withdrawn. The patient tolerated the procedure well.      

## 2018-02-11 NOTE — Anesthesia Preprocedure Evaluation (Addendum)
Anesthesia Evaluation  Patient identified by MRN, date of birth, ID band Patient awake    Reviewed: Allergy & Precautions, H&P , NPO status , Patient's Chart, lab work & pertinent test results  History of Anesthesia Complications Negative for: history of anesthetic complications  Airway Mallampati: II  TM Distance: >3 FB Neck ROM: Full    Dental no notable dental hx. (+) Teeth Intact   Pulmonary neg pulmonary ROS, former smoker,    Pulmonary exam normal        Cardiovascular negative cardio ROS Normal cardiovascular exam     Neuro/Psych negative neurological ROS  negative psych ROS   GI/Hepatic negative GI ROS, Neg liver ROS,   Endo/Other  negative endocrine ROS  Renal/GU negative Renal ROS  negative genitourinary   Musculoskeletal negative musculoskeletal ROS (+)   Abdominal   Peds  Hematology negative hematology ROS (+)   Anesthesia Other Findings   Reproductive/Obstetrics (+) Pregnancy (G3P2, one prior C/S)                            Anesthesia Physical Anesthesia Plan  ASA: II  Anesthesia Plan: Spinal   Post-op Pain Management:    Induction:   PONV Risk Score and Plan: 3 and Ondansetron, Scopolamine patch - Pre-op and Treatment may vary due to age or medical condition  Airway Management Planned: Natural Airway  Additional Equipment: None  Intra-op Plan:   Post-operative Plan:   Informed Consent: I have reviewed the patients History and Physical, chart, labs and discussed the procedure including the risks, benefits and alternatives for the proposed anesthesia with the patient or authorized representative who has indicated his/her understanding and acceptance.     Plan Discussed with:   Anesthesia Plan Comments:        Anesthesia Quick Evaluation

## 2018-02-12 ENCOUNTER — Encounter (HOSPITAL_COMMUNITY): Payer: Self-pay | Admitting: Obstetrics and Gynecology

## 2018-02-12 LAB — CBC
HCT: 28.3 % — ABNORMAL LOW (ref 36.0–46.0)
HEMOGLOBIN: 9.3 g/dL — AB (ref 12.0–15.0)
MCH: 26.6 pg (ref 26.0–34.0)
MCHC: 32.9 g/dL (ref 30.0–36.0)
MCV: 81.1 fL (ref 80.0–100.0)
PLATELETS: 276 10*3/uL (ref 150–400)
RBC: 3.49 MIL/uL — ABNORMAL LOW (ref 3.87–5.11)
RDW: 16.4 % — ABNORMAL HIGH (ref 11.5–15.5)
WBC: 14.2 10*3/uL — AB (ref 4.0–10.5)
nRBC: 0 % (ref 0.0–0.2)

## 2018-02-12 LAB — KLEIHAUER-BETKE STAIN
# Vials RhIg: 1
Fetal Cells %: 0.1 %
Quantitation Fetal Hemoglobin: 5 mL

## 2018-02-12 MED ORDER — RHO D IMMUNE GLOBULIN 1500 UNIT/2ML IJ SOSY
300.0000 ug | PREFILLED_SYRINGE | Freq: Once | INTRAMUSCULAR | Status: AC
  Administered 2018-02-12: 300 ug via INTRAVENOUS
  Filled 2018-02-12: qty 2

## 2018-02-12 NOTE — Progress Notes (Signed)
Subjective: Postpartum Day 1: Cesarean Delivery Patient reports incisional pain, tolerating PO and no problems voiding.  Denies fever, chills, CP or SOB. Bonding well with baby - breastfeeding  Objective: Vital signs in last 24 hours: Temp:  [97.7 F (36.5 C)-98.7 F (37.1 C)] 98.1 F (36.7 C) (10/11 0548) Pulse Rate:  [77-102] 77 (10/11 0548) Resp:  [10-31] 18 (10/11 0548) BP: (96-123)/(54-90) 97/59 (10/11 0548) SpO2:  [95 %-100 %] 98 % (10/11 0548)  Physical Exam:  General: alert, cooperative and no distress Lochia: appropriate Uterine Fundus: firm Incision: no significant drainage DVT Evaluation: No evidence of DVT seen on physical exam.  Recent Labs    02/09/18 1015 02/12/18 0525  HGB 10.9* 9.3*  HCT 34.0* 28.3*    Assessment/Plan: Status post Cesarean section. Doing well postoperatively.  Continue current care Abdominal binder.  Cathrine Muster 02/12/2018, 9:57 AM

## 2018-02-12 NOTE — Progress Notes (Signed)
Ms. Devlin blood type in prenatal records is recorded as A positive. [facility] Lab blood type result is A negative (with weak D positive). Lab explained that the types are different because of a different method of testing. Baby is O positive.Rhogam studies ordered. Patient states she is blood type A positive and has given blood and was told she was A positive. She said she received rhogam shot with her first pregnancy. Dr.Banga called notified of blood type issue. She stated that the patient could get rhogam shot, and said the patient could decide if she wants rhogam. Pt decided to get the shot.

## 2018-02-13 LAB — RH IG WORKUP (INCLUDES ABO/RH)
ABO/RH(D): A NEG
GESTATIONAL AGE(WKS): 39
UNIT DIVISION: 0

## 2018-02-13 MED ORDER — OXYCODONE HCL 5 MG PO TABS: 5.0000 mg | ORAL_TABLET | ORAL | 0 refills | Status: AC | PRN

## 2018-02-13 MED ORDER — IBUPROFEN 800 MG PO TABS: 800.0000 mg | ORAL_TABLET | Freq: Four times a day (QID) | ORAL | 1 refills | Status: DC | PRN

## 2018-02-13 NOTE — Lactation Note (Signed)
This note was copied from a baby's chart. Lactation Consultation Note  Patient Name: Madison Richmond [id number] Date: 02/13/2018 Reason for consult: Initial assessment;Term Baby is 75 hours old and mom has been exclusively formula feeding by choice.  She states breastfeeding did not work out with her previous babies.  She states her nipples are flat and she tried a nipple shield without success.  Mom and baby are discharged today and mom would like to attempt to latch baby prior to going home.  Both breasts are full and nipples flat.  Baby awake and rooting for breast.  She latched easily but unable to sustain a latch.  20 mm nipple shield applied.  Baby latched well with shield and fed actively with good swallows for 15 minutes.  Shield full of milk when baby came off content.  Breast softening noted.  Discussed prevention and treatment of engorgement.  Manual pump given with instructions.  Recommended putting baby to breast with cues and avoid giving formula if breastfeeding is going well.  Breastfeeding consultation services and support information given and reviewed.  Maternal Data    Feeding Feeding Type: Breast Fed  LATCH Score Latch: Grasps breast easily, tongue down, lips flanged, rhythmical sucking.  Audible Swallowing: Spontaneous and intermittent  Type of Nipple: Flat  Comfort (Breast/Nipple): Soft / non-tender  Hold (Positioning): Assistance needed to correctly position infant at breast and maintain latch.  LATCH Score: 8  Interventions Interventions: Assisted with latch;Breast compression;Skin to skin;Adjust position;Breast massage;Support pillows;Hand express;Hand pump  Lactation Tools Discussed/Used Tools: Nipple Shields Nipple shield size: 20   Consult Status Consult Status: Complete    [name] 02/13/2018, 12:32 PM

## 2018-02-13 NOTE — Discharge Summary (Signed)
OB Discharge Summary     Patient Name: Madison Richmond DOB: 1996/04/14 MRN: [id number]  Date of admission: 02/11/2018 Delivering MD: Sherian Rein   Date of discharge: 02/13/2018  Admitting diagnosis: repeat c-section and sterilization Intrauterine pregnancy: [redacted]w[redacted]d     Secondary diagnosis:  Principal Problem:   History of cesarean section, low transverse Active Problems:   Encounter for tubal ligation   Status post repeat low transverse cesarean section  Additional problems: (?) blood type     Discharge diagnosis: Term Pregnancy Delivered                                                                                                Post partum procedures:rhogam  Augmentation: n/a  Complications: None  Hospital course:  Sceduled C/S   22 y.o. yo G3P3003 at [redacted]w[redacted]d was admitted to the hospital 02/11/2018 for scheduled cesarean section with the following indication:Elective Repeat.  Membrane Rupture Time/Date: 10:00 AM ,02/11/2018   Patient delivered a Viable infant.02/11/2018  Details of operation can be found in separate operative note.  Pateint had an uncomplicated postpartum course.  [name], tolerating a regular diet, passing flatus, and urinating well. Patient is discharged home in stable condition on  02/13/18         Physical exam  Vitals:   02/12/18 1000 02/12/18 1451 02/12/18 2300 02/13/18 0700  BP: 131/70 115/80 112/66 118/71  Pulse: 96 95 93 76  Resp: 18 18 18 18   Temp: 98 F (36.7 C) 97.6 F (36.4 C) 97.8 F (36.6 C) 97.9 F (36.6 C)  TempSrc: Oral Oral Oral Oral  SpO2: 97% 97%    Weight:      Height:       General: alert, cooperative and no distress Lochia: appropriate Uterine Fundus: firm Incision: Dressing is clean, dry, and intact DVT Evaluation: No evidence of DVT seen on physical exam. Labs: Lab Results  Component Value Date   WBC 14.2 (H) 02/12/2018   HGB 9.3 (L) 02/12/2018   HCT 28.3 (L) 02/12/2018   MCV 81.1  02/12/2018   PLT 276 02/12/2018   No flowsheet data found.  Discharge instruction: per After Visit Summary and "Baby and Me Booklet".  After visit meds:  Allergies as of 02/13/2018      Reactions   Tetracyclines & Related Other (See Comments)   Causes acid reflux and pain in her stomach      Medication List    TAKE these medications   calcium carbonate 750 MG chewable tablet Commonly known as:  TUMS EX Chew 1 tablet by mouth 4 (four) times daily as needed for heartburn.   ibuprofen 800 MG tablet Commonly known as:  ADVIL,MOTRIN Take 1 tablet (800 mg total) by mouth every 6 (six) hours as needed for cramping.   oxyCODONE 5 MG immediate release tablet Commonly known as:  Oxy IR/ROXICODONE Take 1 tablet (5 mg total) by mouth every 4 (four) hours as needed for up to 7 days for severe pain or breakthrough pain (pain scale 4-7).   prenatal multivitamin Tabs tablet Take 1 tablet by mouth daily  at 12 noon.       Diet: routine diet  Activity: Advance as tolerated. Pelvic rest for 6 weeks.   Outpatient follow up:2 weeks Follow up Appt:No future appointments. Follow up Visit:No follow-ups on file.  Postpartum contraception: Tubal Ligation  Newborn Data: Live born female  Birth Weight: 7 lb 12.7 oz (3535 g) APGAR: 9, 9  Newborn Delivery   Birth date/time:  02/11/2018 10:02:00 Delivery type:  C-Section, Vacuum Assisted Trial of labor:  No C-section categorization:  Repeat     Baby Feeding: Bottle and Breast Disposition:home with mother   02/13/2018 Cathrine Muster, DO

## 2018-02-13 NOTE — Discharge Instructions (Signed)
Nothing in vagina for 6 weeks.  No sex, tampons, and douching.  Other instructions as in [facility] Discharge Booklet. °

## 2018-02-13 NOTE — Progress Notes (Signed)
Patient ID: Madison Richmond, female   DOB: [birth year], 22 y.o.   MRN: [id number] Pt doing well. Had a BM - feels much better. Lochia mild, pain controlled, denies fever, chills, SOB or CP. Ambulating with no complaints. Tol PO. Desires discharge to home today. Bonding well with baby - bottle feeding but considering trying breastfeeding VSS ABD - FF and below umb, less distended, nontender            Dressing c/d/i EXT - no homans or edema  A/P: POD#2 s/p repeat c/s and BTL - stable         Received rhogam         Reviewed discharge instructions.          Discharge to home today

## 2019-04-11 ENCOUNTER — Encounter (HOSPITAL_COMMUNITY): Payer: Self-pay | Admitting: Emergency Medicine

## 2019-04-11 ENCOUNTER — Other Ambulatory Visit: Payer: Self-pay

## 2019-04-11 ENCOUNTER — Emergency Department (HOSPITAL_COMMUNITY)
Admission: EM | Admit: 2019-04-11 | Discharge: 2019-04-12 | Disposition: A | Discharge status: Home / Self Care | Payer: 59 | Source: Home / Self Care

## 2019-04-11 DIAGNOSIS — R110 Nausea: Secondary | ICD-10-CM | POA: Insufficient documentation

## 2019-04-11 DIAGNOSIS — Z881 Allergy status to other antibiotic agents status: Secondary | ICD-10-CM | POA: Diagnosis not present

## 2019-04-11 DIAGNOSIS — Z5321 Procedure and treatment not carried out due to patient leaving prior to being seen by health care provider: Secondary | ICD-10-CM | POA: Insufficient documentation

## 2019-04-11 DIAGNOSIS — K353 Acute appendicitis with localized peritonitis, without perforation or gangrene: Secondary | ICD-10-CM | POA: Diagnosis not present

## 2019-04-11 DIAGNOSIS — Z87891 Personal history of nicotine dependence: Secondary | ICD-10-CM | POA: Diagnosis not present

## 2019-04-11 DIAGNOSIS — Z20828 Contact with and (suspected) exposure to other viral communicable diseases: Secondary | ICD-10-CM | POA: Diagnosis not present

## 2019-04-11 DIAGNOSIS — R109 Unspecified abdominal pain: Secondary | ICD-10-CM | POA: Insufficient documentation

## 2019-04-11 LAB — COMPREHENSIVE METABOLIC PANEL
ALT: 17 U/L (ref 0–44)
AST: 17 U/L (ref 15–41)
Albumin: 4.4 g/dL (ref 3.5–5.0)
Alkaline Phosphatase: 68 U/L (ref 38–126)
Anion gap: 9 (ref 5–15)
BUN: 10 mg/dL (ref 6–20)
CO2: 23 mmol/L (ref 22–32)
Calcium: 9.4 mg/dL (ref 8.9–10.3)
Chloride: 106 mmol/L (ref 98–111)
Creatinine, Ser: 0.71 mg/dL (ref 0.44–1.00)
GFR calc Af Amer: >60 mL/min (ref >60)
GFR calc non Af Amer: >60 mL/min (ref >60)
Glucose, Bld: 103 mg/dL — ABNORMAL HIGH (ref 70–99)
Potassium: 4 mmol/L (ref 3.5–5.1)
Sodium: 138 mmol/L (ref 135–145)
Total Bilirubin: 0.5 mg/dL (ref 0.3–1.2)
Total Protein: 7.4 g/dL (ref 6.5–8.1)

## 2019-04-11 LAB — CBC
HCT: 39.6 % (ref 36.0–46.0)
Hemoglobin: 13.2 g/dL (ref 12.0–15.0)
MCH: 29.1 pg (ref 26.0–34.0)
MCHC: 33.3 g/dL (ref 30.0–36.0)
MCV: 87.2 fL (ref 80.0–100.0)
Platelets: 286 10*3/uL (ref 150–400)
RBC: 4.54 MIL/uL (ref 3.87–5.11)
RDW: 12.6 % (ref 11.5–15.5)
WBC: 16.5 10*3/uL — ABNORMAL HIGH (ref 4.0–10.5)
nRBC: 0 % (ref 0.0–0.2)

## 2019-04-11 LAB — LIPASE, BLOOD: Lipase: 20 U/L (ref 11–51)

## 2019-04-11 LAB — I-STAT BETA HCG BLOOD, ED (MC, WL, AP ONLY): I-stat hCG, quantitative: <5 m[IU]/mL (ref <5)

## 2019-04-11 MED ORDER — SODIUM CHLORIDE 0.9% FLUSH: 3.0000 mL | Freq: Once | INTRAVENOUS | Status: DC

## 2019-04-11 NOTE — ED Triage Notes (Signed)
Patient with abdominal pain all day, nausea, no vomiting.  Patient states she has not been able to eat or drink.

## 2019-04-12 ENCOUNTER — Emergency Department (HOSPITAL_COMMUNITY): Payer: 59 | Admitting: Certified Registered Nurse Anesthetist

## 2019-04-12 ENCOUNTER — Encounter (HOSPITAL_COMMUNITY)
Admission: EM | Disposition: A | Discharge status: Home / Self Care | Payer: Self-pay | Source: Home / Self Care | Attending: Emergency Medicine

## 2019-04-12 ENCOUNTER — Emergency Department (HOSPITAL_COMMUNITY): Payer: 59

## 2019-04-12 ENCOUNTER — Observation Stay (HOSPITAL_COMMUNITY)
Admission: EM | Admit: 2019-04-12 | Discharge: 2019-04-13 | Disposition: A | Discharge status: Home / Self Care | Payer: 59 | Attending: General Surgery | Admitting: General Surgery

## 2019-04-12 ENCOUNTER — Encounter (HOSPITAL_COMMUNITY): Payer: Self-pay

## 2019-04-12 DIAGNOSIS — K3580 Unspecified acute appendicitis: Secondary | ICD-10-CM | POA: Diagnosis present

## 2019-04-12 DIAGNOSIS — K353 Acute appendicitis with localized peritonitis, without perforation or gangrene: Principal | ICD-10-CM | POA: Insufficient documentation

## 2019-04-12 DIAGNOSIS — Z881 Allergy status to other antibiotic agents status: Secondary | ICD-10-CM | POA: Insufficient documentation

## 2019-04-12 DIAGNOSIS — Z20828 Contact with and (suspected) exposure to other viral communicable diseases: Secondary | ICD-10-CM | POA: Insufficient documentation

## 2019-04-12 DIAGNOSIS — Z87891 Personal history of nicotine dependence: Secondary | ICD-10-CM | POA: Insufficient documentation

## 2019-04-12 HISTORY — PX: LAPAROSCOPIC APPENDECTOMY: SHX408

## 2019-04-12 LAB — RESPIRATORY PANEL BY RT PCR (FLU A&B, COVID)
Influenza A by PCR: NEGATIVE
Influenza B by PCR: NEGATIVE
SARS Coronavirus 2 by RT PCR: NEGATIVE

## 2019-04-12 SURGERY — APPENDECTOMY, LAPAROSCOPIC
Anesthesia: General | Site: Abdomen

## 2019-04-12 MED ORDER — FENTANYL CITRATE (PF) 100 MCG/2ML IJ SOLN
INTRAMUSCULAR | Status: DC | PRN
  Administered 2019-04-12 (×2): 50 ug via INTRAVENOUS
  Administered 2019-04-12: 100 ug via INTRAVENOUS

## 2019-04-12 MED ORDER — MEPERIDINE HCL 50 MG/ML IJ SOLN: 6.2500 mg | INTRAMUSCULAR | Status: DC | PRN

## 2019-04-12 MED ORDER — MIDAZOLAM HCL 2 MG/2ML IJ SOLN
INTRAMUSCULAR | Status: AC
  Filled 2019-04-12: qty 2

## 2019-04-12 MED ORDER — FENTANYL CITRATE (PF) 250 MCG/5ML IJ SOLN
INTRAMUSCULAR | Status: AC
  Filled 2019-04-12: qty 5

## 2019-04-12 MED ORDER — PROMETHAZINE HCL 25 MG/ML IJ SOLN: 6.2500 mg | INTRAMUSCULAR | Status: DC | PRN

## 2019-04-12 MED ORDER — ROCURONIUM BROMIDE 10 MG/ML (PF) SYRINGE
PREFILLED_SYRINGE | INTRAVENOUS | Status: AC
  Filled 2019-04-12: qty 10

## 2019-04-12 MED ORDER — ONDANSETRON HCL 4 MG/2ML IJ SOLN
INTRAMUSCULAR | Status: DC | PRN
  Administered 2019-04-12: 4 mg via INTRAVENOUS

## 2019-04-12 MED ORDER — MORPHINE SULFATE (PF) 4 MG/ML IV SOLN
4.0000 mg | Freq: Once | INTRAVENOUS | Status: AC
  Administered 2019-04-12: 4 mg via INTRAVENOUS
  Filled 2019-04-12: qty 1

## 2019-04-12 MED ORDER — MORPHINE SULFATE (PF) 2 MG/ML IV SOLN
2.0000 mg | INTRAVENOUS | Status: DC | PRN
  Administered 2019-04-12: 4 mg via INTRAVENOUS
  Filled 2019-04-12 (×2): qty 2

## 2019-04-12 MED ORDER — LABETALOL HCL 5 MG/ML IV SOLN
INTRAVENOUS | Status: AC
  Filled 2019-04-12: qty 4

## 2019-04-12 MED ORDER — OXYCODONE HCL 5 MG/5ML PO SOLN: 5.0000 mg | Freq: Once | ORAL | Status: DC | PRN

## 2019-04-12 MED ORDER — HYDROMORPHONE HCL 1 MG/ML IJ SOLN
INTRAMUSCULAR | Status: AC
  Filled 2019-04-12: qty 2

## 2019-04-12 MED ORDER — PROPOFOL 10 MG/ML IV BOLUS
INTRAVENOUS | Status: DC | PRN
  Administered 2019-04-12: 200 mg via INTRAVENOUS

## 2019-04-12 MED ORDER — HYDROMORPHONE HCL 1 MG/ML IJ SOLN
0.2500 mg | INTRAMUSCULAR | Status: DC | PRN
  Administered 2019-04-12 (×2): 0.5 mg via INTRAVENOUS

## 2019-04-12 MED ORDER — OXYCODONE HCL 5 MG PO TABS: 5.0000 mg | ORAL_TABLET | Freq: Once | ORAL | Status: DC | PRN

## 2019-04-12 MED ORDER — ONDANSETRON HCL 4 MG/2ML IJ SOLN
4.0000 mg | Freq: Once | INTRAMUSCULAR | Status: AC
  Administered 2019-04-12: 4 mg via INTRAVENOUS
  Filled 2019-04-12: qty 2

## 2019-04-12 MED ORDER — SUGAMMADEX SODIUM 200 MG/2ML IV SOLN
INTRAVENOUS | Status: DC | PRN
  Administered 2019-04-12: 200 mg via INTRAVENOUS

## 2019-04-12 MED ORDER — DEXAMETHASONE SODIUM PHOSPHATE 10 MG/ML IJ SOLN
INTRAMUSCULAR | Status: AC
  Filled 2019-04-12: qty 1

## 2019-04-12 MED ORDER — ONDANSETRON HCL 4 MG/2ML IJ SOLN
4.0000 mg | Freq: Four times a day (QID) | INTRAMUSCULAR | Status: DC | PRN
  Administered 2019-04-12: 4 mg via INTRAVENOUS
  Filled 2019-04-12: qty 2

## 2019-04-12 MED ORDER — MIDAZOLAM HCL 5 MG/5ML IJ SOLN
INTRAMUSCULAR | Status: DC | PRN
  Administered 2019-04-12: 2 mg via INTRAVENOUS

## 2019-04-12 MED ORDER — AMPHETAMINE-DEXTROAMPHETAMINE 10 MG PO TABS
15.0000 mg | ORAL_TABLET | Freq: Two times a day (BID) | ORAL | Status: DC
  Administered 2019-04-12 – 2019-04-13 (×2): 15 mg via ORAL
  Filled 2019-04-12 (×2): qty 2

## 2019-04-12 MED ORDER — SODIUM CHLORIDE (PF) 0.9 % IJ SOLN
INTRAMUSCULAR | Status: AC
  Filled 2019-04-12: qty 50

## 2019-04-12 MED ORDER — ONDANSETRON HCL 4 MG/2ML IJ SOLN
INTRAMUSCULAR | Status: AC
  Filled 2019-04-12: qty 2

## 2019-04-12 MED ORDER — ROCURONIUM BROMIDE 10 MG/ML (PF) SYRINGE
PREFILLED_SYRINGE | INTRAVENOUS | Status: DC | PRN
  Administered 2019-04-12: 50 mg via INTRAVENOUS

## 2019-04-12 MED ORDER — BUPIVACAINE HCL (PF) 0.25 % IJ SOLN
INTRAMUSCULAR | Status: AC
  Filled 2019-04-12: qty 30

## 2019-04-12 MED ORDER — LACTATED RINGERS IV SOLN
INTRAVENOUS | Status: DC
  Administered 2019-04-12 – 2019-04-13 (×2): via INTRAVENOUS

## 2019-04-12 MED ORDER — SODIUM CHLORIDE 0.9 % IV BOLUS
1000.0000 mL | Freq: Once | INTRAVENOUS | Status: AC
  Administered 2019-04-12: 1000 mL via INTRAVENOUS

## 2019-04-12 MED ORDER — DEXAMETHASONE SODIUM PHOSPHATE 10 MG/ML IJ SOLN
INTRAMUSCULAR | Status: DC | PRN
  Administered 2019-04-12: 8 mg via INTRAVENOUS

## 2019-04-12 MED ORDER — SODIUM CHLORIDE 0.9 % IV SOLN
2.0000 g | Freq: Once | INTRAVENOUS | Status: AC
  Administered 2019-04-12: 2 g via INTRAVENOUS
  Filled 2019-04-12: qty 20

## 2019-04-12 MED ORDER — METRONIDAZOLE IN NACL 5-0.79 MG/ML-% IV SOLN
500.0000 mg | Freq: Once | INTRAVENOUS | Status: AC
  Administered 2019-04-12: 500 mg via INTRAVENOUS
  Filled 2019-04-12: qty 100

## 2019-04-12 MED ORDER — MORPHINE SULFATE (PF) 4 MG/ML IV SOLN
4.0000 mg | INTRAVENOUS | Status: DC | PRN
  Administered 2019-04-12: 4 mg via INTRAVENOUS
  Filled 2019-04-12: qty 1

## 2019-04-12 MED ORDER — SUCCINYLCHOLINE CHLORIDE 200 MG/10ML IV SOSY
PREFILLED_SYRINGE | INTRAVENOUS | Status: AC
  Filled 2019-04-12: qty 10

## 2019-04-12 MED ORDER — BUPIVACAINE-EPINEPHRINE 0.25% -1:200000 IJ SOLN
INTRAMUSCULAR | Status: DC | PRN
  Administered 2019-04-12: 20 mL

## 2019-04-12 MED ORDER — AEROCHAMBER PLUS FLO-VU LARGE MISC
Status: AC
  Filled 2019-04-12: qty 1

## 2019-04-12 MED ORDER — OXYCODONE HCL 5 MG PO TABS
5.0000 mg | ORAL_TABLET | ORAL | Status: DC | PRN
  Administered 2019-04-12 – 2019-04-13 (×2): 5 mg via ORAL
  Filled 2019-04-12 (×2): qty 1

## 2019-04-12 MED ORDER — PROPOFOL 10 MG/ML IV BOLUS
INTRAVENOUS | Status: AC
  Filled 2019-04-12: qty 20

## 2019-04-12 MED ORDER — LIDOCAINE 2% (20 MG/ML) 5 ML SYRINGE
INTRAMUSCULAR | Status: DC | PRN
  Administered 2019-04-12: 60 mg via INTRAVENOUS

## 2019-04-12 MED ORDER — IOHEXOL 300 MG/ML  SOLN
100.0000 mL | Freq: Once | INTRAMUSCULAR | Status: AC | PRN
  Administered 2019-04-12: 100 mL via INTRAVENOUS

## 2019-04-12 MED ORDER — LIDOCAINE 2% (20 MG/ML) 5 ML SYRINGE
INTRAMUSCULAR | Status: AC
  Filled 2019-04-12: qty 5

## 2019-04-12 SURGICAL SUPPLY — 38 items
ADH SKN CLS APL DERMABOND .7 (GAUZE/BANDAGES/DRESSINGS) ×1
APL PRP STRL LF DISP 70% ISPRP (MISCELLANEOUS) ×1
APPLIER CLIP ROT 10 11.4 M/L (STAPLE)
APR CLP MED LRG 11.4X10 (STAPLE)
BAG SPEC RTRVL LRG 6X4 10 (ENDOMECHANICALS) ×1
CABLE HIGH FREQUENCY MONO STRZ (ELECTRODE) ×3 IMPLANT
CHLORAPREP W/TINT 26 (MISCELLANEOUS) ×3 IMPLANT
CLIP APPLIE ROT 10 11.4 M/L (STAPLE) IMPLANT
CONT SPEC 4OZ CLIKSEAL STRL BL (MISCELLANEOUS) ×2 IMPLANT
COVER WAND RF STERILE (DRAPES) IMPLANT
CUTTER FLEX LINEAR 45M (STAPLE) ×2 IMPLANT
DECANTER SPIKE VIAL GLASS SM (MISCELLANEOUS) ×3 IMPLANT
DERMABOND ADVANCED (GAUZE/BANDAGES/DRESSINGS) ×2
DERMABOND ADVANCED .7 DNX12 (GAUZE/BANDAGES/DRESSINGS) ×1 IMPLANT
ELECT REM PT RETURN 15FT ADLT (MISCELLANEOUS) ×3 IMPLANT
ENDOLOOP SUT PDS II  0 18 (SUTURE)
ENDOLOOP SUT PDS II 0 18 (SUTURE) IMPLANT
GLOVE BIO SURGEON STRL SZ7.5 (GLOVE) ×3 IMPLANT
GOWN STRL REUS W/TWL XL LVL3 (GOWN DISPOSABLE) ×6 IMPLANT
KIT BASIN OR (CUSTOM PROCEDURE TRAY) ×3 IMPLANT
KIT TURNOVER KIT A (KITS) ×2 IMPLANT
PENCIL SMOKE EVACUATOR (MISCELLANEOUS) IMPLANT
POUCH SPECIMEN RETRIEVAL 10MM (ENDOMECHANICALS) ×3 IMPLANT
RELOAD 45 VASCULAR/THIN (ENDOMECHANICALS) IMPLANT
RELOAD STAPLE 45 2.5 WHT GRN (ENDOMECHANICALS) IMPLANT
RELOAD STAPLE 45 3.5 BLU ETS (ENDOMECHANICALS) IMPLANT
RELOAD STAPLE TA45 3.5 REG BLU (ENDOMECHANICALS) ×6 IMPLANT
SCISSORS LAP 5X35 DISP (ENDOMECHANICALS) ×3 IMPLANT
SET IRRIG TUBING LAPAROSCOPIC (IRRIGATION / IRRIGATOR) ×3 IMPLANT
SET TUBE SMOKE EVAC HIGH FLOW (TUBING) ×3 IMPLANT
SHEARS HARMONIC ACE PLUS 36CM (ENDOMECHANICALS) ×3 IMPLANT
SUT MNCRL AB 4-0 PS2 18 (SUTURE) ×3 IMPLANT
SUT VIC AB 0 UR5 27 (SUTURE) ×2 IMPLANT
TOWEL OR 17X26 10 PK STRL BLUE (TOWEL DISPOSABLE) ×3 IMPLANT
TRAY FOLEY MTR SLVR 16FR STAT (SET/KITS/TRAYS/PACK) IMPLANT
TRAY LAPAROSCOPIC (CUSTOM PROCEDURE TRAY) ×3 IMPLANT
TROCAR BLADELESS OPT 5 100 (ENDOMECHANICALS) ×3 IMPLANT
TROCAR XCEL BLUNT TIP 100MML (ENDOMECHANICALS) ×3 IMPLANT

## 2019-04-12 NOTE — ED Notes (Signed)
Pelvic cart at bedside. 

## 2019-04-12 NOTE — Discharge Instructions (Signed)
[facility] ° °LAPAROSCOPIC SURGERY: POST OP INSTRUCTIONS °Always review your discharge instruction sheet given to you by the facility where your surgery was performed. °IF YOU HAVE DISABILITY OR FAMILY LEAVE FORMS, YOU MUST BRING THEM TO THE OFFICE FOR PROCESSING.   °DO NOT GIVE THEM TO YOUR DOCTOR. ° °PAIN CONTROL ° °1. First take acetaminophen (Tylenol) AND/or ibuprofen (Advil) to control your pain after surgery.  Follow directions on package.  Taking acetaminophen (Tylenol) and/or ibuprofen (Advil) regularly after surgery will help to control your pain and lower the amount of prescription pain medication you may need.  You should not take more than 4,000 mg (4 grams) of acetaminophen (Tylenol) in 24 hours.  You should not take ibuprofen (Advil), aleve, motrin, naprosyn or other NSAIDS if you have a history of stomach ulcers or chronic kidney disease.  °2. A prescription for pain medication may be given to you upon discharge.  Take your pain medication as prescribed, if you still have uncontrolled pain after taking acetaminophen (Tylenol) or ibuprofen (Advil). °3. Use ice packs to help control pain. °4. If you need a refill on your pain medication, please contact your pharmacy.  They will contact our office to request authorization. Prescriptions will not be filled after 5pm or on week-ends. ° °HOME MEDICATIONS °5. Take your usually prescribed medications unless otherwise directed. ° °DIET °6. You should follow a light diet the first few days after arrival home.  Be sure to include lots of fluids daily. Avoid fatty, fried foods.  ° °CONSTIPATION °7. It is common to experience some constipation after surgery and if you are taking pain medication.  Increasing fluid intake and taking a stool softener (such as Colace) will usually help or prevent this problem from occurring.  A mild laxative (Milk of Magnesia or Miralax) should be taken according to package instructions if there are no bowel  movements after 48 hours. ° °WOUND/INCISION CARE °8. Most patients will experience some swelling and bruising in the area of the incisions.  Ice packs will help.  Swelling and bruising can take several days to resolve.  °9. Unless discharge instructions indicate otherwise, follow guidelines below  °a. STERI-STRIPS - you may remove your outer bandages 48 hours after surgery, and you may shower at that time.  You have steri-strips (small skin tapes) in place directly over the incision.  These strips should be left on the skin for 7-10 days.   °b. DERMABOND/SKIN GLUE - you may shower in 24 hours.  The glue will flake off over the next 2-3 weeks. °10. Any sutures or staples will be removed at the office during your follow-up visit. ° °ACTIVITIES °11. You may resume regular (light) daily activities beginning the next day--such as daily self-care, walking, climbing stairs--gradually increasing activities as tolerated.  You may have sexual intercourse when it is comfortable.  Refrain from any heavy lifting or straining until approved by your doctor. °a. You may drive when you are no longer taking prescription pain medication, you can comfortably wear a seatbelt, and you can safely maneuver your car and apply brakes. ° °FOLLOW-UP °12. You should see your doctor in the office for a follow-up appointment approximately 2-3 weeks after your surgery.  You should have been given your post-op/follow-up appointment when your surgery was scheduled.  If you did not receive a post-op/follow-up appointment, make sure that you call for this appointment within a day or two after you arrive home to insure a convenient appointment time. ° ° °  WHEN TO CALL YOUR DOCTOR: °1. Fever over 101.0 °2. Inability to urinate °3. Continued bleeding from incision. °4. Increased pain, redness, or drainage from the incision. °5. Increasing abdominal pain ° °The clinic staff is available to answer your questions during regular business hours.  Please don’t  hesitate to call and ask to speak to one of the nurses for clinical concerns.  If you have a medical emergency, go to the nearest emergency room or call 911.  A surgeon from [facility] is always on call at the hospital. °[address], [address], [place], [place]  [address] ? [address] [address], [place], [place]   [address] °[phone] ? [phone] ? FAX [phone] ° °••••••••• ° ° °Managing Your Pain After Surgery Without Opioids ° ° ° °Thank you for participating in our program to help patients manage their pain after surgery without opioids. This is part of our effort to provide you with the best care possible, without exposing you or your family to the risk that opioids pose. ° °What pain can I expect after surgery? °You can expect to have some pain after surgery. This is normal. The pain is typically worse the day after surgery, and quickly begins to get better. °Many studies have found that many patients are able to manage their pain after surgery with Over-the-Counter (OTC) medications such as Tylenol and Motrin. If you have a condition that does not allow you to take Tylenol or Motrin, notify your surgical team. ° °How will I manage my pain? °The best strategy for controlling your pain after surgery is around the clock pain control with Tylenol (acetaminophen) and Motrin (ibuprofen or Advil). Alternating these medications with each other allows you to maximize your pain control. In addition to Tylenol and Motrin, you can use heating pads or ice packs on your incisions to help reduce your pain. ° °How will I alternate your regular strength over-the-counter pain medication? °You will take a dose of pain medication every three hours. °; Start by taking 650 mg of Tylenol (2 pills of 325 mg) °; 3 hours later take 600 mg of Motrin (3 pills of 200 mg) °; 3 hours after taking the Motrin take 650 mg of Tylenol °; 3 hours after that take 600 mg of Motrin. ° ° °- 1 - ° °See example - if your first  dose of Tylenol is at 12:00 PM ° ° °12:00 PM Tylenol 650 mg (2 pills of 325 mg)  °3:00 PM Motrin 600 mg (3 pills of 200 mg)  °6:00 PM Tylenol 650 mg (2 pills of 325 mg)  °9:00 PM Motrin 600 mg (3 pills of 200 mg)  °Continue alternating every 3 hours  ° °We recommend that you follow this schedule around-the-clock for at least 3 days after surgery, or until you feel that it is no longer needed. Use the table on the last page of this handout to keep track of the medications you are taking. °Important: °Do not take more than 3000mg of Tylenol or 3200mg of Motrin in a 24-hour period. °Do not take ibuprofen/Motrin if you have a history of bleeding stomach ulcers, severe kidney disease, &/or actively taking a blood thinner ° °What if I still have pain? °If you have pain that is not controlled with the over-the-counter pain medications (Tylenol and Motrin or Advil) you might have what we call “breakthrough” pain. You will receive a prescription for a small amount of an opioid pain medication such as Oxycodone, Tramadol, or   Tylenol with Codeine. Use these opioid pills in the first 24 hours after surgery if you have breakthrough pain. Do not take more than 1 pill every 4-6 hours. ° °If you still have uncontrolled pain after using all opioid pills, don't hesitate to call our staff using the number provided. We will help make sure you are managing your pain in the best way possible, and if necessary, we can provide a prescription for additional pain medication. ° ° °Day 1   ° °Time  °Name of Medication Number of pills taken  °Amount of Acetaminophen  °Pain Level  ° °Comments  °AM PM       °AM PM       °AM PM       °AM PM       °AM PM       °AM PM       °AM PM       °AM PM       °Total Daily amount of Acetaminophen °Do not take more than  3,000 mg per day    ° ° °Day 2   ° °Time  °Name of Medication Number of pills °taken  °Amount of Acetaminophen  °Pain Level  ° °Comments  °AM PM       °AM PM       °AM PM       °AM PM       °AM  PM       °AM PM       °AM PM       °AM PM       °Total Daily amount of Acetaminophen °Do not take more than  3,000 mg per day    ° ° °Day 3   ° °Time  °Name of Medication Number of pills taken  °Amount of Acetaminophen  °Pain Level  ° °Comments  °AM PM       °AM PM       °AM PM       °AM PM       ° ° ° °AM PM       °AM PM       °AM PM       °AM PM       °Total Daily amount of Acetaminophen °Do not take more than  3,000 mg per day    ° ° °Day 4   ° °Time  °Name of Medication Number of pills taken  °Amount of Acetaminophen  °Pain Level  ° °Comments  °AM PM       °AM PM       °AM PM       °AM PM       °AM PM       °AM PM       °AM PM       °AM PM       °Total Daily amount of Acetaminophen °Do not take more than  3,000 mg per day    ° ° °Day 5   ° °Time  °Name of Medication Number °of pills taken  °Amount of Acetaminophen  °Pain Level  ° °Comments  °AM PM       °AM PM       °AM PM       °AM PM       °AM PM       °AM PM       °AM PM       °  AM PM       °Total Daily amount of Acetaminophen °Do not take more than  3,000 mg per day    ° ° ° °Day 6   ° °Time  °Name of Medication Number of pills °taken  °Amount of Acetaminophen  °Pain Level  °Comments  °AM PM       °AM PM       °AM PM       °AM PM       °AM PM       °AM PM       °AM PM       °AM PM       °Total Daily amount of Acetaminophen °Do not take more than  3,000 mg per day    ° ° °Day 7   ° °Time  °Name of Medication Number of pills taken  °Amount of Acetaminophen  °Pain Level  ° °Comments  °AM PM       °AM PM       °AM PM       °AM PM       °AM PM       °AM PM       °AM PM       °AM PM       °Total Daily amount of Acetaminophen °Do not take more than  3,000 mg per day    ° ° ° ° °For additional information about how and where to safely dispose of unused opioid °medications - https://www.morepowerfulnc.org ° °Disclaimer: This document contains information and/or instructional materials adapted from [facility] for the typical patient with your condition. It does  not replace medical advice from your health care provider because your experience may differ from that of the °typical patient. Talk to your health care provider if you have any questions about this °document, your condition or your treatment plan. °Adapted from [facility] ° ° °

## 2019-04-12 NOTE — Anesthesia Preprocedure Evaluation (Signed)
Anesthesia Evaluation  Patient identified by MRN, date of birth, ID band Patient awake    Reviewed: Allergy & Precautions, NPO status , Patient's Chart, lab work & pertinent test results  History of Anesthesia Complications Negative for: history of anesthetic complications  Airway Mallampati: II  TM Distance: >3 FB Neck ROM: Full    Dental  (+) Teeth Intact   Pulmonary neg pulmonary ROS, former smoker,    Pulmonary exam normal        Cardiovascular negative cardio ROS Normal cardiovascular exam     Neuro/Psych negative neurological ROS  negative psych ROS   GI/Hepatic negative GI ROS, Neg liver ROS,   Endo/Other  negative endocrine ROS  Renal/GU negative Renal ROS  negative genitourinary   Musculoskeletal negative musculoskeletal ROS (+)   Abdominal   Peds  Hematology negative hematology ROS (+)   Anesthesia Other Findings   Reproductive/Obstetrics                             Anesthesia Physical Anesthesia Plan  ASA: I and emergent  Anesthesia Plan: General   Post-op Pain Management:    Induction: Intravenous  PONV Risk Score and Plan: 3 and Ondansetron, Dexamethasone, Treatment may vary due to age or medical condition and Midazolam  Airway Management Planned: Oral ETT  Additional Equipment: None  Intra-op Plan:   Post-operative Plan: Extubation in OR  Informed Consent: I have reviewed the patients History and Physical, chart, labs and discussed the procedure including the risks, benefits and alternatives for the proposed anesthesia with the patient or authorized representative who has indicated his/her understanding and acceptance.     Dental advisory given  Plan Discussed with:   Anesthesia Plan Comments:         Anesthesia Quick Evaluation

## 2019-04-12 NOTE — Anesthesia Procedure Notes (Signed)
Procedure Name: Intubation Date/Time: 04/12/2019 1:10 PM Performed by: Gerald Leitz, CRNA Pre-anesthesia Checklist: Patient identified, Patient being monitored, Timeout performed, Emergency Drugs available and Suction available Patient Re-evaluated:Patient Re-evaluated prior to induction Oxygen Delivery Method: Circle system utilized Preoxygenation: Pre-oxygenation with 100% oxygen Induction Type: IV induction Ventilation: Mask ventilation without difficulty Laryngoscope Size: Mac and 3 Grade View: Grade I Tube type: Oral Tube size: 7.0 mm Number of attempts: 1 Placement Confirmation: ETT inserted through vocal cords under direct vision,  positive ETCO2 and breath sounds checked- equal and bilateral Secured at: 21 cm Tube secured with: Tape Dental Injury: Teeth and Oropharynx as per pre-operative assessment

## 2019-04-12 NOTE — ED Triage Notes (Signed)
Patient arrived stating that yesterday morning she began having lower abdominal pain. Reports that after her c-section she struggles with gas pain, tried Gas x and a stool softener with no relief. Denies any NVD.

## 2019-04-12 NOTE — ED Provider Notes (Signed)
[facility]-EMERGENCY DEPT Provider Note   CSN: [id number] Arrival date & time: 04/12/19  0139     History   Chief Complaint Chief Complaint  Patient presents with  . Abdominal Pain    HPI LETY CULLENS is a 23 y.o. female.     Patient presents to the emergency department for evaluation of abdominal pain.  Symptoms began early this morning.  She reports diffuse lower abdominal pain and cramping.  She has had nausea but no vomiting.  She has felt constipated, used a laxative without improvement.  She has not had any fever.  She denies urinary symptoms.  Patient denies vaginal discharge and bleeding.     Past Medical History:  Diagnosis Date  . Breech presentation on examination, fetus 1 05/12/2016  . Chlamydia   . Encounter for tubal ligation 02/10/2018  . History of cesarean section, low transverse 02/09/2018  . History of gonorrhea   . S/P cesarean section 05/13/2016  . SVD (spontaneous vaginal delivery) 12/01/2011  . Vaginal Pap smear, abnormal     Patient Active Problem List   Diagnosis Date Noted  . Status post repeat low transverse cesarean section 02/11/2018  . Encounter for tubal ligation 02/10/2018  . History of cesarean section, low transverse 02/09/2018  . S/P cesarean section 05/13/2016  . Breech presentation on examination, fetus 1 05/12/2016    Past Surgical History:  Procedure Laterality Date  . CESAREAN SECTION N/A 05/13/2016   Procedure: CESAREAN SECTION;  Surgeon: Sherian Rein, MD;  Location: [facility];  Service: Obstetrics;  Laterality: N/A;  MD requests RNFA- Genice Rouge  . CESAREAN SECTION WITH BILATERAL TUBAL LIGATION Bilateral 02/11/2018   Procedure: REPEAT CESAREAN SECTION WITH BILATERAL TUBAL LIGATION;  Surgeon: Sherian Rein, MD;  Location: [facility];  Service: Obstetrics;  Laterality: Bilateral;  [name],  RNFA  . EYE SURGERY     blocked L tear duct  . Surgery for blocked tear duct        OB History    Gravida  3   Para  3   Term  3   Preterm  0   AB  0   Living  3     SAB  0   TAB  0   Ectopic  0   Multiple  0   Live Births  3            Home Medications    Prior to Admission medications   Medication Sig Start Date End Date Taking? Authorizing Provider  calcium carbonate (TUMS EX) 750 MG chewable tablet Chew 1 tablet by mouth 4 (four) times daily as needed for heartburn.    [provider]  ibuprofen (ADVIL,MOTRIN) 800 MG tablet Take 1 tablet (800 mg total) by mouth every 6 (six) hours as needed for cramping. 02/13/18   Edwinna Areola, DO  Prenatal Vit-Fe Fumarate-FA (PRENATAL MULTIVITAMIN) TABS tablet Take 1 tablet by mouth daily at 12 noon.    [provider]    Family History Family History  Problem Relation Age of Onset  . Hypertension Mother     Social History Social History   Tobacco Use  . Smoking status: Former Smoker    Types: Cigarettes  . Smokeless tobacco: Never Used  Substance Use Topics  . Alcohol use: No  . Drug use: No     Allergies   Tetracyclines & related   Review of Systems Review of Systems  Gastrointestinal: Positive for abdominal pain, constipation  and nausea.  All other systems reviewed and are negative.    Physical Exam Updated Vital Signs BP 127/80 (BP Location: Left Arm)   Pulse 88   Temp 97.7 F (36.5 C) (Oral)   Resp 16   LMP 04/08/2019 (Exact Date) Comment: negative HCG quantitative 04/11/19  SpO2 100%   Physical Exam Vitals signs and nursing note reviewed.  Constitutional:      General: She is not in acute distress.    Appearance: Normal appearance. She is well-developed.  HENT:     Head: Normocephalic and atraumatic.     Right Ear: Hearing normal.     Left Ear: Hearing normal.     Nose: Nose normal.  Eyes:     Conjunctiva/sclera: Conjunctivae normal.     Pupils: Pupils are equal, round, and reactive to light.  Neck:     Musculoskeletal: Normal range  of motion and neck supple.  Cardiovascular:     Rate and Rhythm: Regular rhythm.     Heart sounds: S1 normal and S2 normal. No murmur. No friction rub. No gallop.   Pulmonary:     Effort: Pulmonary effort is normal. No respiratory distress.     Breath sounds: Normal breath sounds.  Chest:     Chest wall: No tenderness.  Abdominal:     General: Bowel sounds are normal.     Palpations: Abdomen is soft.     Tenderness: There is abdominal tenderness in the right lower quadrant. There is guarding. There is no rebound. Negative signs include Murphy's sign and McBurney's sign.     Hernia: No hernia is present.  Musculoskeletal: Normal range of motion.  Skin:    General: Skin is warm and dry.     Findings: No rash.  Neurological:     Mental Status: She is alert and oriented to person, place, and time.     GCS: GCS eye subscore is 4. GCS verbal subscore is 5. GCS motor subscore is 6.     Cranial Nerves: No cranial nerve deficit.     Sensory: No sensory deficit.     Coordination: Coordination normal.  Psychiatric:        Speech: Speech normal.        Behavior: Behavior normal.        Thought Content: Thought content normal.      ED Treatments / Results  Labs (all labs ordered are listed, but only abnormal results are displayed) Labs Reviewed  RESPIRATORY PANEL BY RT PCR (FLU A&B, COVID)    EKG None  Radiology Ct Abdomen Pelvis W Contrast  Result Date: 04/12/2019 CLINICAL DATA:  Abdominal pain EXAM: CT ABDOMEN AND PELVIS WITH CONTRAST TECHNIQUE: Multidetector CT imaging of the abdomen and pelvis was performed using the standard protocol following bolus administration of intravenous contrast. CONTRAST:  100mL OMNIPAQUE IOHEXOL 300 MG/ML  SOLN COMPARISON:  None. FINDINGS: LOWER CHEST: No basilar pleural or apical pericardial effusion. HEPATOBILIARY: Normal hepatic contours. No intra- or extrahepatic biliary dilatation. Normal gallbladder. PANCREAS: Normal pancreas. No ductal  dilatation or peripancreatic fluid collection. SPLEEN: Normal. ADRENALS/URINARY TRACT: --Adrenal glands: Normal. --Kidneys and ureters: No hydronephrosis, nephroureterolithiasis or solid renal mass. --Urinary bladder: Normal for degree of distention STOMACH/BOWEL: --Stomach/Duodenum: No hiatal hernia. Normal duodenal course and caliber. --Small bowel: No dilatation or inflammation. --Colon: No focal abnormality. --Appendix: Location: Pre-ileal Diameter: 8 mm Appendicolith: Present Mucosal hyper-enhancement: None. There is mild surrounding inflammatory stranding. Extraluminal gas: None Periappendiceal collection: None VASCULAR/LYMPHATIC: Normal course and caliber of the major  abdominal vessels. No abdominal or pelvic lymphadenopathy. REPRODUCTIVE: Normal uterus and ovaries. MUSCULOSKELETAL. No bony spinal canal stenosis or focal osseous abnormality. OTHER: None. IMPRESSION: Mildly dilated appendix with mild surrounding inflammatory stranding, likely early acute appendicitis. No evidence of perforation or abscess formation. Electronically Signed   By: Ulyses Jarred M.D.   On: 04/12/2019 02:49    Procedures Procedures (including critical care time)  Medications Ordered in ED Medications  sodium chloride (PF) 0.9 % injection (has no administration in time range)  cefTRIAXone (ROCEPHIN) 2 g in sodium chloride 0.9 % 100 mL IVPB (2 g Intravenous New Bag/Given (Non-Interop) 04/12/19 0317)    And  metroNIDAZOLE (FLAGYL) IVPB 500 mg (500 mg Intravenous New Bag/Given (Non-Interop) 04/12/19 0320)  sodium chloride 0.9 % bolus 1,000 mL (1,000 mLs Intravenous New Bag/Given 04/12/19 0230)  morphine 4 MG/ML injection 4 mg (4 mg Intravenous Given 04/12/19 0228)  ondansetron (ZOFRAN) injection 4 mg (4 mg Intravenous Given 04/12/19 0227)  iohexol (OMNIPAQUE) 300 MG/ML solution 100 mL (100 mLs Intravenous Contrast Given 04/12/19 0233)     Initial Impression / Assessment and Plan / ED Course  I have reviewed the triage  vital signs and the nursing notes.  Pertinent labs & imaging results that were available during my care of the patient were reviewed by me and considered in my medical decision making (see chart for details).        Patient presents to the emergency department for evaluation of abdominal pain.  Symptoms began early this morning and have been present through the day.  She has anorexia, nausea without vomiting.  Exam reveals tenderness in the right lower quadrant with mild guarding, no peritoneal signs.  Patient has a leukocytosis (she was seen at [facility] earlier tonight and had blood work but left due to long wait times and came to [facility]).  CT scan performed does show evidence of early acute appendicitis.  Patient administered antibiotics, discussed briefly with Dr. Lucia Gaskins.  Patient will be seen by general surgery for definitive care.  Final Clinical Impressions(s) / ED Diagnoses   Final diagnoses:  Acute appendicitis, unspecified acute appendicitis type    ED Discharge Orders    None       Orpah Greek, MD 04/12/19 [id number]

## 2019-04-12 NOTE — H&P (Signed)
[facility] Surgery Consult/Admission Note  Madison Richmond 1996-02-26  [id number].    Requesting Provider: Dr. Betsey Holiday Chief Complaint/Reason for Consult: appendicitis  HPI:  Patient is a 23 yo female with a hx of two C sections who presented to the ED with abdominal pain. Pain started yesterday morning, mild around her umbilicus. Pain progressively worsened and moved to her RLQ. Pain is severe, constant, non radiating. Pain medicine given in ED helps. Associated nausea, chills and dizziness. Patient denies fever, vomiting, diarrhea, CP, SOB, or other associated symptoms. Patient denies anticoagulation.   ROS:  Review of Systems  Constitutional: Positive for chills. Negative for diaphoresis and fever.  HENT: Negative for sore throat.   Respiratory: Negative for cough and shortness of breath.   Cardiovascular: Negative for chest pain.  Gastrointestinal: Positive for abdominal pain and nausea. Negative for blood in stool, constipation, diarrhea and vomiting.  Genitourinary: Negative for dysuria.  Skin: Negative for rash.  Neurological: Positive for dizziness. Negative for loss of consciousness.  All other systems reviewed and are negative.    Family History  Problem Relation Age of Onset  . Hypertension Mother     Past Medical History:  Diagnosis Date  . Breech presentation on examination, fetus 1 05/12/2016  . Chlamydia   . Encounter for tubal ligation 02/10/2018  . History of cesarean section, low transverse 02/09/2018  . History of gonorrhea   . S/P cesarean section 05/13/2016  . SVD (spontaneous vaginal delivery) 12/01/2011  . Vaginal Pap smear, abnormal     Past Surgical History:  Procedure Laterality Date  . CESAREAN SECTION N/A 05/13/2016   Procedure: CESAREAN SECTION;  Surgeon: Janyth Contes, MD;  Location: [place];  Service: Obstetrics;  Laterality: N/A;  MD requests RNFA- Maida Sale  . CESAREAN SECTION WITH BILATERAL TUBAL LIGATION Bilateral  02/11/2018   Procedure: REPEAT CESAREAN SECTION WITH BILATERAL TUBAL LIGATION;  Surgeon: Janyth Contes, MD;  Location: [place];  Service: Obstetrics;  Laterality: Bilateral;  [name],  RNFA  . EYE SURGERY     blocked L tear duct  . Surgery for blocked tear duct      Social History:  reports that she has quit smoking. Her smoking use included cigarettes. She has never used smokeless tobacco. She reports that she does not drink alcohol or use drugs.  Allergies:  Allergies  Allergen Reactions  . Tetracyclines & Related Other (See Comments)    Causes acid reflux and pain in her stomach    (Not in a hospital admission)   Blood pressure 112/78, pulse 82, temperature 97.7 F (36.5 C), temperature source Oral, resp. rate 15, last menstrual period 04/08/2019, SpO2 100 %, unknown if currently breastfeeding.  Physical Exam Constitutional:      General: She is not in acute distress.    Appearance: Normal appearance. She is not ill-appearing, toxic-appearing or diaphoretic.  HENT:     Head: Normocephalic and atraumatic.     Nose: Nose normal.     Mouth/Throat:     Comments: Patient wearing mask Eyes:     General: No scleral icterus.       Right eye: No discharge.        Left eye: No discharge.     Conjunctiva/sclera: Conjunctivae normal.     Pupils: Pupils are equal, round, and reactive to light.  Neck:     Musculoskeletal: Normal range of motion and neck supple.  Cardiovascular:     Rate and Rhythm: Normal rate and regular rhythm.  Pulses:          Radial pulses are 2+ on the right side and 2+ on the left side.       Dorsalis pedis pulses are 2+ on the right side and 2+ on the left side.     Heart sounds: Normal heart sounds. No murmur.  Pulmonary:     Effort: Pulmonary effort is normal. No respiratory distress.     Breath sounds: Normal breath sounds. No wheezing, rhonchi or rales.  Abdominal:     General: Bowel sounds are normal. There is no distension.      Palpations: Abdomen is soft. Abdomen is not rigid.     Tenderness: There is abdominal tenderness in the right lower quadrant. There is guarding.     Hernia: No hernia is present.  Musculoskeletal: Normal range of motion.        General: No tenderness or deformity.     Right lower leg: No edema.     Left lower leg: No edema.  Skin:    General: Skin is warm and dry.     Findings: No rash.  Neurological:     Mental Status: She is alert and oriented to person, place, and time.     Sensory: Sensation is intact.     Motor: Motor function is intact.  Psychiatric:        Mood and Affect: Mood normal.        Behavior: Behavior normal.     Results for orders placed or performed during the hospital encounter of 04/12/19 (from the past 48 hour(s))  Respiratory Panel by RT PCR (Flu A&B, Covid) - Nasopharyngeal Swab     Status: None   Collection Time: 04/12/19  6:46 AM   Specimen: Nasopharyngeal Swab  Result Value Ref Range   SARS Coronavirus 2 by RT PCR NEGATIVE NEGATIVE    Comment: (NOTE) SARS-CoV-2 target nucleic acids are NOT DETECTED. The SARS-CoV-2 RNA is generally detectable in upper respiratoy specimens during the acute phase of infection. The lowest concentration of SARS-CoV-2 viral copies this assay can detect is 131 copies/mL. A negative result does not preclude SARS-Cov-2 infection and should not be used as the sole basis for treatment or other patient management decisions. A negative result may occur with  improper specimen collection/handling, submission of specimen other than nasopharyngeal swab, presence of viral mutation(s) within the areas targeted by this assay, and inadequate number of viral copies (<131 copies/mL). A negative result must be combined with clinical observations, patient history, and epidemiological information. The expected result is Negative. Fact Sheet for Patients:  https://www.moore.com/https://www.fda.gov/media/142436/download Fact Sheet for Healthcare Providers:   https://www.young.biz/https://www.fda.gov/media/142435/download This test is not yet ap proved or cleared by the [place] FDA and  has been authorized for detection and/or diagnosis of SARS-CoV-2 by FDA under an Emergency Use Authorization (EUA). This EUA will remain  in effect (meaning this test can be used) for the duration of the COVID-19 declaration under Section 564(b)(1) of the Act, 21 U.S.C. section 360bbb-3(b)(1), unless the authorization is terminated or revoked sooner.    Influenza A by PCR NEGATIVE NEGATIVE   Influenza B by PCR NEGATIVE NEGATIVE    Comment: (NOTE) The Xpert Xpress SARS-CoV-2/FLU/RSV assay is intended as an aid in  the diagnosis of influenza from Nasopharyngeal swab specimens and  should not be used as a sole basis for treatment. Nasal washings and  aspirates are unacceptable for Xpert Xpress SARS-CoV-2/FLU/RSV  testing. Fact Sheet for Patients: https://www.moore.com/https://www.fda.gov/media/142436/download Fact Sheet for Healthcare  Providers: https://www.young.biz/ This test is not yet approved or cleared by the [place] and  has been authorized for detection and/or diagnosis of SARS-CoV-2 by  FDA under an Emergency Use Authorization (EUA). This EUA will remain  in effect (meaning this test can be used) for the duration of the  Covid-19 declaration under Section 564(b)(1) of the Act, 21  U.S.C. section 360bbb-3(b)(1), unless the authorization is  terminated or revoked. Performed at [facility], [address], [place], [place] [address]    Ct Abdomen Pelvis W Contrast  Result Date: 04/12/2019 CLINICAL DATA:  Abdominal pain EXAM: CT ABDOMEN AND PELVIS WITH CONTRAST TECHNIQUE: Multidetector CT imaging of the abdomen and pelvis was performed using the standard protocol following bolus administration of intravenous contrast. CONTRAST:  OMNIPAQUE IOHEXOL 300 MG/ML  SOLN COMPARISON:  None. FINDINGS: LOWER CHEST: No basilar pleural or apical  pericardial effusion. HEPATOBILIARY: Normal hepatic contours. No intra- or extrahepatic biliary dilatation. Normal gallbladder. PANCREAS: Normal pancreas. No ductal dilatation or peripancreatic fluid collection. SPLEEN: Normal. ADRENALS/URINARY TRACT: --Adrenal glands: Normal. --Kidneys and ureters: No hydronephrosis, nephroureterolithiasis or solid renal mass. --Urinary bladder: Normal for degree of distention STOMACH/BOWEL: --Stomach/Duodenum: No hiatal hernia. Normal duodenal course and caliber. --Small bowel: No dilatation or inflammation. --Colon: No focal abnormality. --Appendix: Location: Pre-ileal Diameter: 8 mm Appendicolith: Present Mucosal hyper-enhancement: None. There is mild surrounding inflammatory stranding. Extraluminal gas: None Periappendiceal collection: None VASCULAR/LYMPHATIC: Normal course and caliber of the major abdominal vessels. No abdominal or pelvic lymphadenopathy. REPRODUCTIVE: Normal uterus and ovaries. MUSCULOSKELETAL. No bony spinal canal stenosis or focal osseous abnormality. OTHER: None. IMPRESSION: Mildly dilated appendix with mild surrounding inflammatory stranding, likely early acute appendicitis. No evidence of perforation or abscess formation. Electronically Signed   By: Deatra Robinson M.D.   On: 04/12/2019 02:49      Assessment/Plan Active Problems:   * No active hospital problems. *   Appendicitis - OR today for lap appy, possible discharge from PACU  FEN: NPO, IVF VTE: SCD's, lovenox on hold 2/2 surgery ID: Rocephin & Flagyl Foley: none Follow up: [facility]CS 2 weeks  Plan: OR today for lap chole   Jerre Simon, [facility] Surgery 04/12/2019, 8:31 AM Please see amion for pager for the following: M, T, W, & Friday 7:00am - 4:30pm Thursdays 7:00am -11:30am

## 2019-04-12 NOTE — Anesthesia Postprocedure Evaluation (Signed)
Anesthesia Post Note  Patient: Madison Richmond  Procedure(s) Performed: APPENDECTOMY LAPAROSCOPIC (N/A Abdomen)     Patient location during evaluation: PACU Anesthesia Type: General Level of consciousness: awake and alert Pain management: pain level controlled Vital Signs Assessment: post-procedure vital signs reviewed and stable Respiratory status: spontaneous breathing, nonlabored ventilation and respiratory function stable Cardiovascular status: blood pressure returned to baseline and stable Postop Assessment: no apparent nausea or vomiting Anesthetic complications: no    Last Vitals:  Vitals:   04/12/19 1500 04/12/19 1515  BP: 127/75 124/82  Pulse: 81 76  Resp: 12 14  Temp:    SpO2: 98% 93%    Last Pain:  Vitals:   04/12/19 1500  TempSrc:   PainSc: Asleep                 Lynda Rainwater

## 2019-04-12 NOTE — Transfer of Care (Signed)
Immediate Anesthesia Transfer of Care Note  Patient: Madison Richmond  Procedure(s) Performed: Procedure(s): APPENDECTOMY LAPAROSCOPIC (N/A)  Patient Location: PACU  Anesthesia Type:General  Level of Consciousness: Alert, Awake, Oriented  Airway & Oxygen Therapy: Patient Spontanous Breathing  Post-op Assessment: Report given to RN  Post vital signs: Reviewed and stable  Last Vitals:  Vitals:   04/12/19 1213 04/12/19 1422  BP: 125/85 136/86  Pulse: 87 96  Resp: 16 (P) 14  Temp: 36.9 C (P) 36.4 C  SpO2: 173% 567%    Complications: No apparent anesthesia complications

## 2019-04-12 NOTE — ED Notes (Signed)
Pts mother stated she is taking her to [place] [facility].

## 2019-04-12 NOTE — Op Note (Signed)
04/12/2019  2:09 PM  PATIENT:  Madison Richmond  23 y.o. female  PRE-OPERATIVE DIAGNOSIS:  appendicitis  POST-OPERATIVE DIAGNOSIS:  appendicitis  PROCEDURE:  Procedure(s): APPENDECTOMY LAPAROSCOPIC (N/A)  SURGEON:  Surgeon(s) and Role:    * Griselda Miner, MD - Primary  PHYSICIAN ASSISTANT:   ASSISTANTS: none   ANESTHESIA:   local and general  EBL:  minimal   BLOOD ADMINISTERED:none  DRAINS: none   LOCAL MEDICATIONS USED:  MARCAINE     SPECIMEN:  Source of Specimen:  appendix  DISPOSITION OF SPECIMEN:  PATHOLOGY  COUNTS:  YES  TOURNIQUET:  * No tourniquets in log *  DICTATION: .Dragon Dictation   After informed consent was obtained patient was brought to the operating room placed in the supine position on the operating room table. After adequate induction of general anesthesia the patient's abdomen was prepped with ChloraPrep, allowed to dry, and draped in usual sterile manner. The area below the umbilicus was infiltrated with quarter percent Marcaine. A small incision was made with a 15 blade knife. This incision was carried down through the subcutaneous tissue bluntly with a hemostat and [employer] retractors until the linea alba was identified. The linea alba was incised with a 15 blade knife. Each side was grasped Coker clamps and elevated anteriorly. The preperitoneal space was probed bluntly with a hemostat until the peritoneum was opened and access was gained to the abdominal cavity. A 0 Vicryl purse string stitch was placed in the fascia surrounding the opening. A Hassan cannula was placed through the opening and anchored in place with the previously placed Vicryl purse string stitch. The laparoscope was placed through the [facility] cannula. The abdomen was insufflated with carbon dioxide without difficulty. Next the suprapubic area was infiltrated with quarter percent Marcaine. A small incision was made with a 15 blade knife. A 5 mm port was placed bluntly through this  incision into the abdominal cavity. A site was then chosen between the 2 port for placement of a 5 mm port. The area was infiltrated with quarter percent Marcaine. A small stab incision was made with a 15 blade knife. A 5 mm port was placed bluntly through this incision and the abdominal cavity under direct vision. The laparoscope was then moved to the suprapubic port. Using a Glassman grasper and harmonic scalpel the right lower quadrant was inspected. The appendix was readily identified. The appendix was elevated anteriorly and the mesoappendix was taken down sharply with the harmonic scalpel. Once the base of the appendix where it joined the cecum was identified and cleared of any tissue then a laparoscopic GIA blue load 6 row stapler was placed through the [facility] cannula. The stapler was placed across the base of the appendix clamped and fired thereby dividing the base of the appendix between staple lines. A laparoscopic bag was then inserted through the [facility] cannula. The appendix was placed within the bag and the bag was sealed. The abdomen was then irrigated with copious amounts of saline until the effluent was clear. No other abnormalities were noted. The appendix and bag were removed with the [facility] cannula through the infraumbilical port without difficulty. The fascial defect was closed with the previously placed Vicryl pursestring stitch as well as with another interrupted 0 Vicryl figure-of-eight stitch. The rest of the ports were removed under direct vision and were found to be hemostatic. The gas was allowed to escape. The skin incisions were closed with interrupted 4-0 Monocryl subcuticular stitches. Dermabond dressings were applied. The patient  tolerated the procedure well. At the end of the case all needle sponge and instrument counts were correct. The patient was then awakened and taken to recovery in stable condition.  PLAN OF CARE: Admit for overnight observation  PATIENT DISPOSITION:  PACU  - hemodynamically stable.   Delay start of Pharmacological VTE agent (>24hrs) due to surgical blood loss or risk of bleeding: not applicable

## 2019-04-13 ENCOUNTER — Encounter (HOSPITAL_COMMUNITY): Payer: Self-pay | Admitting: General Surgery

## 2019-04-13 MED ORDER — OXYCODONE HCL 5 MG PO TABS: 5.0000 mg | ORAL_TABLET | ORAL | 0 refills | Status: AC | PRN

## 2019-04-13 NOTE — Progress Notes (Signed)
D/C instructions given to patient. Patient had no questions. NT or Writer will wheel patient out once family comes in

## 2019-04-13 NOTE — Discharge Summary (Signed)
[facility] Surgery/Trauma Discharge Summary   Patient ID: Madison Richmond MRN: [id number] DOB/AGE: 1996/01/24 23 y.o.  Admit date: 04/12/2019 Discharge date: 04/13/2019  Admitting Diagnosis: Appendicitis   Discharge Diagnosis Patient Active Problem List   Diagnosis Date Noted  . Acute appendicitis 04/12/2019  . Status post repeat low transverse cesarean section 02/11/2018  . Encounter for tubal ligation 02/10/2018  . History of cesarean section, low transverse 02/09/2018  . S/P cesarean section 05/13/2016  . Breech presentation on examination, fetus 1 05/12/2016    Consultants none  Imaging: Ct Abdomen Pelvis W Contrast  Result Date: 04/12/2019 CLINICAL DATA:  Abdominal pain EXAM: CT ABDOMEN AND PELVIS WITH CONTRAST TECHNIQUE: Multidetector CT imaging of the abdomen and pelvis was performed using the standard protocol following bolus administration of intravenous contrast. CONTRAST:  OMNIPAQUE IOHEXOL 300 MG/ML  SOLN COMPARISON:  None. FINDINGS: LOWER CHEST: No basilar pleural or apical pericardial effusion. HEPATOBILIARY: Normal hepatic contours. No intra- or extrahepatic biliary dilatation. Normal gallbladder. PANCREAS: Normal pancreas. No ductal dilatation or peripancreatic fluid collection. SPLEEN: Normal. ADRENALS/URINARY TRACT: --Adrenal glands: Normal. --Kidneys and ureters: No hydronephrosis, nephroureterolithiasis or solid renal mass. --Urinary bladder: Normal for degree of distention STOMACH/BOWEL: --Stomach/Duodenum: No hiatal hernia. Normal duodenal course and caliber. --Small bowel: No dilatation or inflammation. --Colon: No focal abnormality. --Appendix: Location: Pre-ileal Diameter: 8 mm Appendicolith: Present Mucosal hyper-enhancement: None. There is mild surrounding inflammatory stranding. Extraluminal gas: None Periappendiceal collection: None VASCULAR/LYMPHATIC: Normal course and caliber of the major abdominal vessels. No abdominal or pelvic lymphadenopathy.  REPRODUCTIVE: Normal uterus and ovaries. MUSCULOSKELETAL. No bony spinal canal stenosis or focal osseous abnormality. OTHER: None. IMPRESSION: Mildly dilated appendix with mild surrounding inflammatory stranding, likely early acute appendicitis. No evidence of perforation or abscess formation. Electronically Signed   By: Deatra Robinson M.D.   On: 04/12/2019 02:49    Procedures Dr. Carolynne Edouard (04/12/2019) - Laparoscopic Appendectomy  HPI: Patient is a 23 yo female with a hx of two C sections who presented to the ED with abdominal pain. Pain started yesterday morning, mild around her umbilicus. Pain progressively worsened and moved to her RLQ. Pain is severe, constant, non radiating. Pain medicine given in ED helps. Associated nausea, chills and dizziness. Patient denies fever, vomiting, diarrhea, CP, SOB, or other associated symptoms. Patient denies anticoagulation.   Hospital Course:  Workup showed appendicitis.  Patient was admitted and underwent procedure listed above.  Tolerated procedure well and was transferred to the floor.  Diet was advanced as tolerated.  On POD#1, the patient was voiding well, tolerating diet, ambulating well, pain well controlled, vital signs stable, incisions c/d/i and felt stable for discharge home.  Patient will follow up as outlined below and knows to call with questions or concerns.     Patient was discharged in good condition.  The [place] Substance controlled database was reviewed prior to prescribing narcotic pain medication to this patient.  Physical Exam: General:  Alert, NAD, pleasant, cooperative Cardio: RRR, S1 & S2 normal, no murmur, rubs, gallops Resp: Effort and rate normal, lungs CTA bilaterally, no wheezes, rales, rhonchi Abd:  Soft, ND, normal bowel sounds, incisions with glue intact are well appearing and are without bleeding or signs of infection. Mild generalized TTP. No peritonitis  Skin: warm and dry, no rashes noted  Allergies as of 04/13/2019       Reactions   Tetracyclines & Related Other (See Comments)   Causes acid reflux and pain in her stomach      Medication  List    STOP taking these medications   ibuprofen 800 MG tablet Commonly known as: ADVIL     TAKE these medications   amphetamine-dextroamphetamine 15 MG tablet Commonly known as: ADDERALL Take 1 tablet by mouth 2 (two) times daily.   calcium carbonate 750 MG chewable tablet Commonly known as: TUMS EX Chew 1 tablet by mouth 4 (four) times daily as needed for heartburn.   docusate sodium 100 MG capsule Commonly known as: COLACE Take 100 mg by mouth daily as needed for mild constipation.   oxyCODONE 5 MG immediate release tablet Commonly known as: Oxy IR/ROXICODONE Take 1 tablet (5 mg total) by mouth every 4 (four) hours as needed for moderate pain.   simethicone 80 MG chewable tablet Commonly known as: MYLICON Chew 80 mg by mouth every 6 (six) hours as needed for flatulence.        Follow-up Information    [facility] [place] Follow up on 05/03/2019.   Specialty: General Surgery Why: 9:00am, this will be a telephone visit.  please have your phone with you to receive a call from [place], our PA, in the office.  If you have any concerns about your incisions you may email a pictures to [email or web address] Contact information: [place] [address] [phone]           Signed: [facility] 04/13/2019, 8:11 AM Please see amion for pager for the following: M, T, W, & Friday 7:00am - 4:30pm Thursdays 7:00am -11:30am

## 2019-04-14 LAB — SURGICAL PATHOLOGY

## 2020-09-21 IMAGING — CT CT ABD-PELV W/ CM
2 of 4 series · 16 of 46 positions shown, 18 images · IV contrast (OMNIPAQUE 300)
Comparison: None.

CLINICAL DATA: Abdominal pain

EXAM:
CT ABDOMEN AND PELVIS WITH CONTRAST
TECHNIQUE: Multidetector CT imaging of the abdomen and pelvis was performed
using the standard protocol following bolus administration of
intravenous contrast.
CONTRAST:  100mL OMNIPAQUE IOHEXOL 300 MG/ML  SOLN

[Series 2: axial st · axial · 0.64mm/px · z∈[+1159,+1554]mm · 13 of 89 slices shown, 15 images]
[im 5/89  soft-tissue]
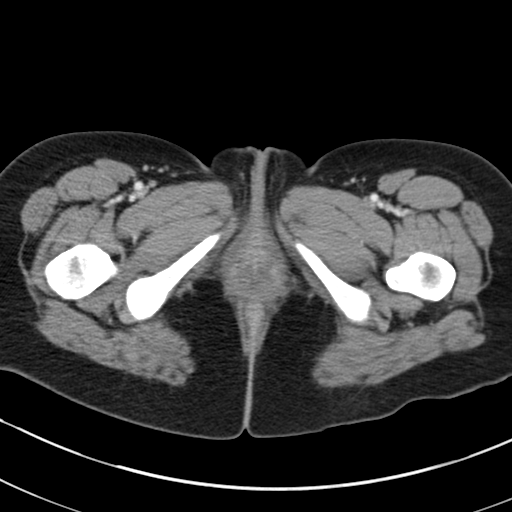
[im 5/89  bone]
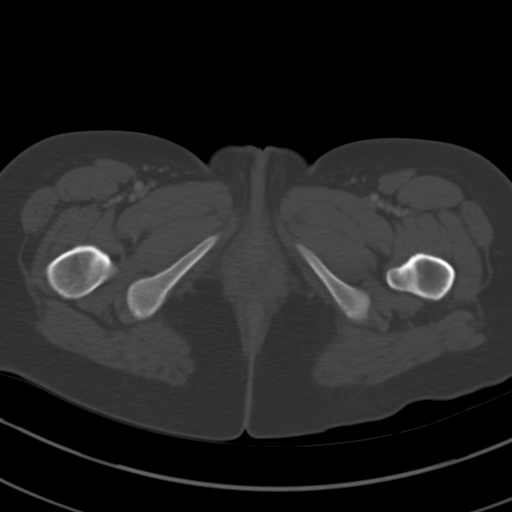
[im 13/89  soft-tissue]
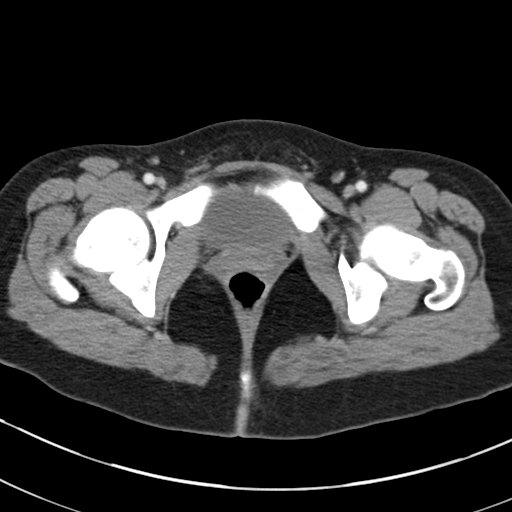
[im 17/89  soft-tissue]
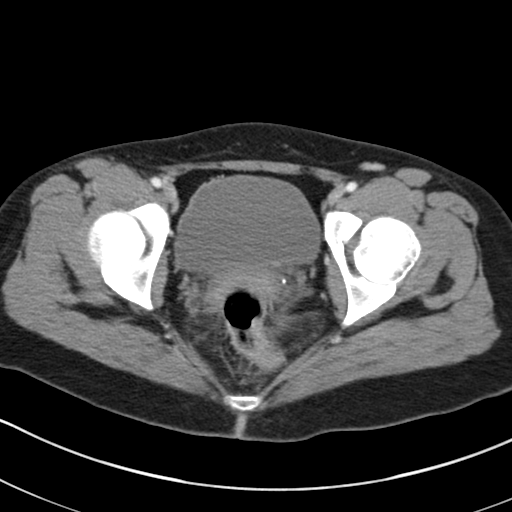
[im 26/89  soft-tissue]
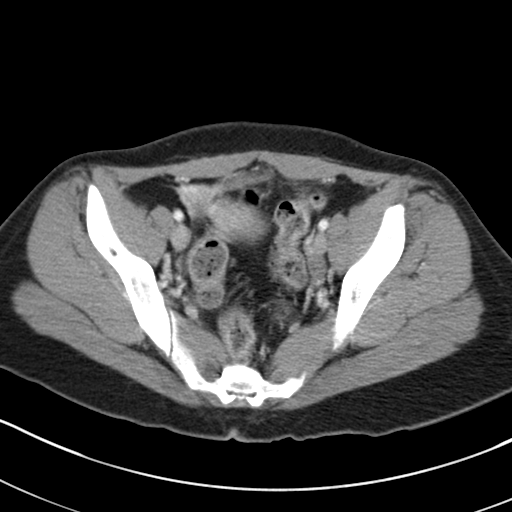
[im 30/89  soft-tissue]
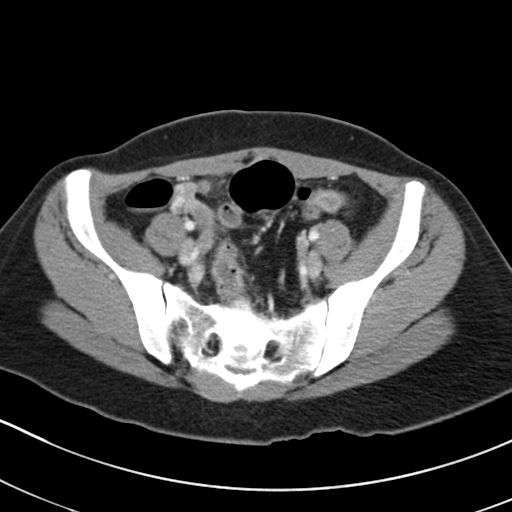
[im 38/89  soft-tissue]
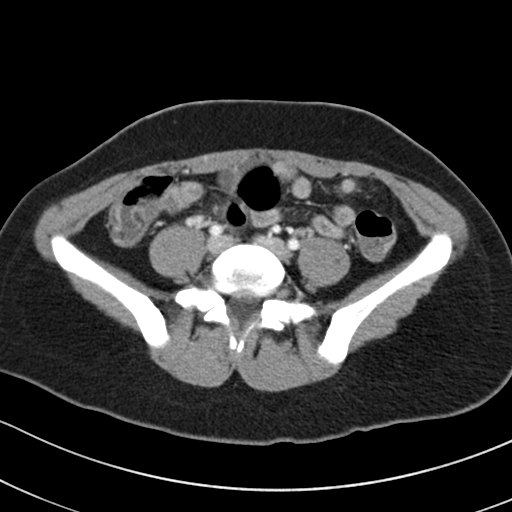
[im 47/89  soft-tissue]
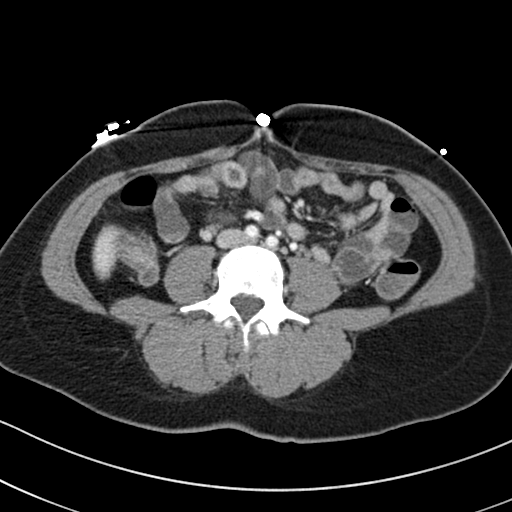
[im 51/89  soft-tissue]
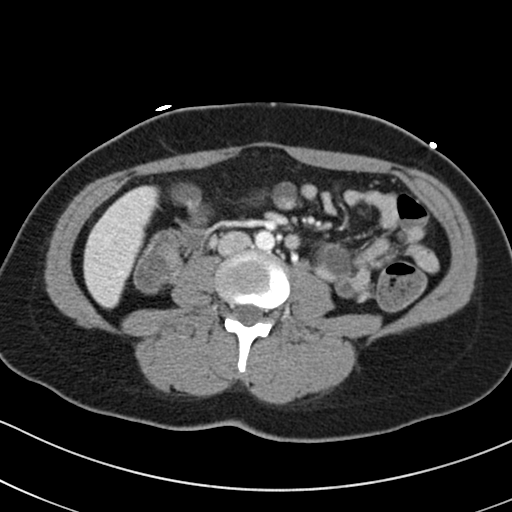
[im 59/89  soft-tissue]
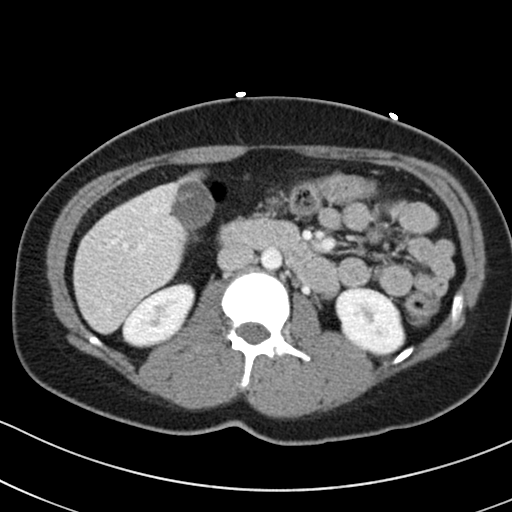
[im 59/89  bone]
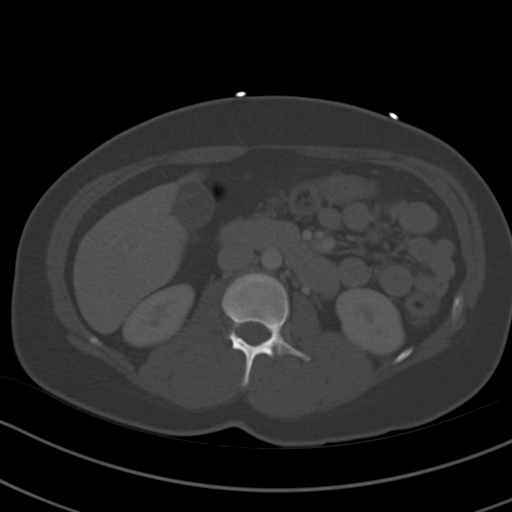
[im 63/89  soft-tissue]
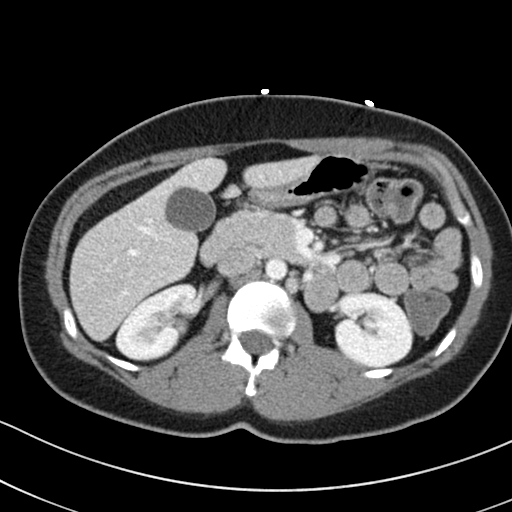
[im 72/89  soft-tissue]
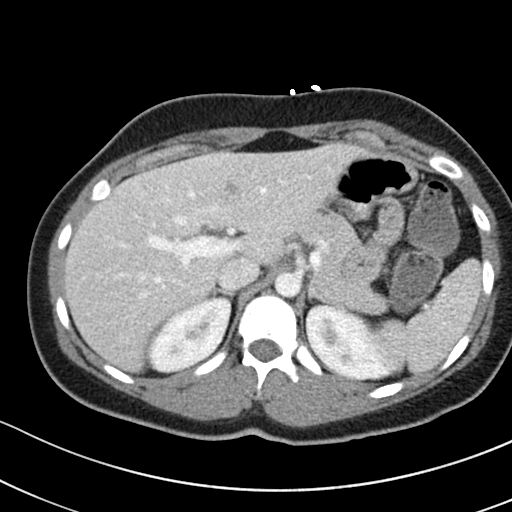
[im 76/89  soft-tissue]
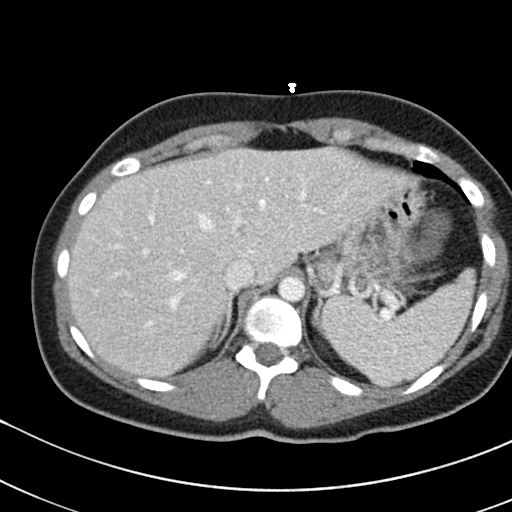
[im 84/89  soft-tissue]
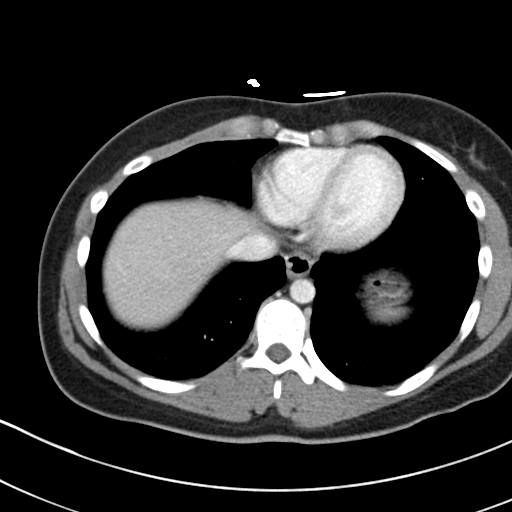

[Series 4: coronal st · coronal · 0.65mm/px · 3 of 106 slices shown]
[im 36/106  soft-tissue]
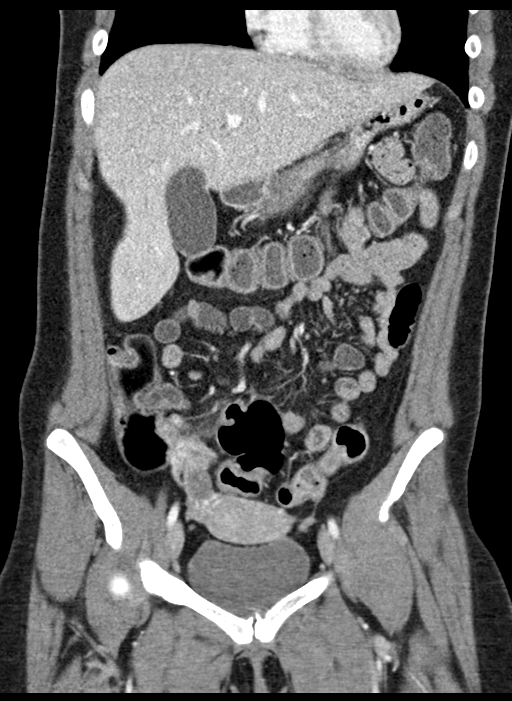
[im 47/106  soft-tissue]
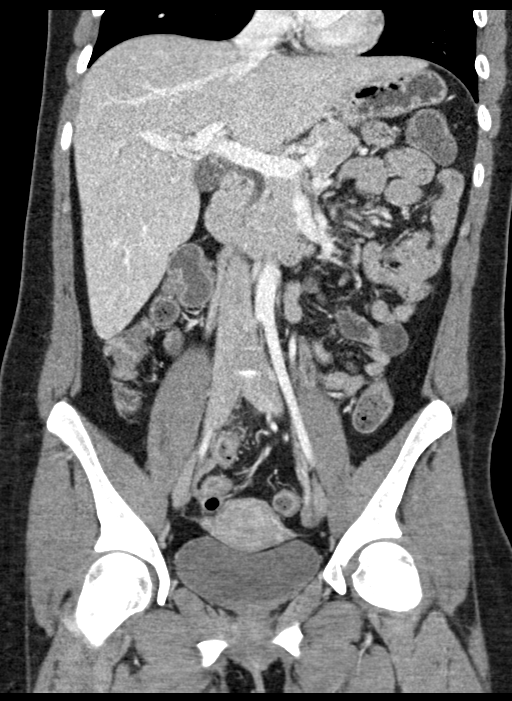
[im 59/106  soft-tissue]
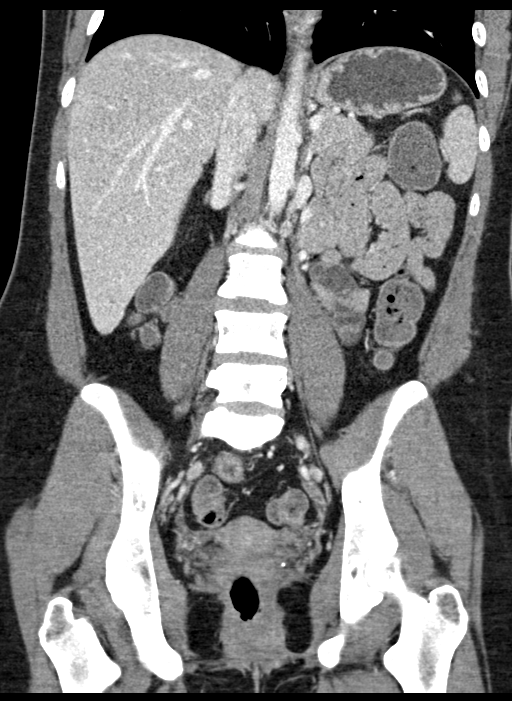

[16 of 46 positions shown; findings below may reference images not displayed]

FINDINGS: LOWER CHEST: No basilar pleural or apical pericardial effusion.

HEPATOBILIARY: Normal hepatic contours. No intra- or extrahepatic
biliary dilatation. Normal gallbladder.

PANCREAS: Normal pancreas. No ductal dilatation or peripancreatic
fluid collection.

SPLEEN: Normal.

ADRENALS/URINARY TRACT:

--Adrenal glands: Normal.

--Kidneys and ureters: No hydronephrosis, nephroureterolithiasis or
solid renal mass.

--Urinary bladder: Normal for degree of distention

STOMACH/BOWEL:

--Stomach/Duodenum: No hiatal hernia. Normal duodenal course and
caliber.

--Small bowel: No dilatation or inflammation.

--Colon: No focal abnormality.

--Appendix:

Location: Pre-ileal

Diameter: 8 mm

Appendicolith: Present

Mucosal hyper-enhancement: None. There is mild surrounding
inflammatory stranding.

Extraluminal gas: None

Periappendiceal collection: None

VASCULAR/LYMPHATIC: Normal course and caliber of the major abdominal
vessels. No abdominal or pelvic lymphadenopathy.

REPRODUCTIVE: Normal uterus and ovaries.

MUSCULOSKELETAL. No bony spinal canal stenosis or focal osseous
abnormality.

OTHER: None.
IMPRESSION: Mildly dilated appendix with mild surrounding inflammatory
stranding, likely early acute appendicitis. No evidence of
perforation or abscess formation.

## 2021-10-25 ENCOUNTER — Ambulatory Visit
Admission: EM | Admit: 2021-10-25 | Discharge: 2021-10-25 | Disposition: A | Discharge status: Home / Self Care | Payer: Medicaid Other | Attending: Emergency Medicine | Admitting: Emergency Medicine

## 2021-10-25 ENCOUNTER — Encounter: Payer: Self-pay | Admitting: Emergency Medicine

## 2021-10-25 DIAGNOSIS — L0201 Cutaneous abscess of face: Secondary | ICD-10-CM | POA: Diagnosis not present

## 2021-10-25 MED ORDER — METHYLPREDNISOLONE SODIUM SUCC 125 MG IJ SOLR
60.0000 mg | Freq: Once | INTRAMUSCULAR | Status: AC
  Administered 2021-10-25: 60 mg via INTRAMUSCULAR

## 2021-10-25 MED ORDER — PREDNISONE 20 MG PO TABS: 40.0000 mg | ORAL_TABLET | Freq: Every day | ORAL | 0 refills | Status: DC

## 2021-10-25 MED ORDER — CEPHALEXIN 500 MG PO CAPS: 500.0000 mg | ORAL_CAPSULE | Freq: Two times a day (BID) | ORAL | 0 refills | Status: DC

## 2021-10-26 ENCOUNTER — Telehealth: Payer: Self-pay | Admitting: Emergency Medicine

## 2021-10-26 DIAGNOSIS — L0201 Cutaneous abscess of face: Secondary | ICD-10-CM

## 2021-10-26 MED ORDER — CEPHALEXIN 500 MG PO CAPS: 500.0000 mg | ORAL_CAPSULE | Freq: Two times a day (BID) | ORAL | 0 refills | Status: AC

## 2021-10-26 MED ORDER — PREDNISONE 20 MG PO TABS: 40.0000 mg | ORAL_TABLET | Freq: Every day | ORAL | 0 refills | Status: AC

## 2023-10-28 DIAGNOSIS — F17200 Nicotine dependence, unspecified, uncomplicated: Secondary | ICD-10-CM | POA: Diagnosis not present

## 2023-10-28 DIAGNOSIS — Z79899 Other long term (current) drug therapy: Secondary | ICD-10-CM | POA: Diagnosis not present

## 2023-10-28 DIAGNOSIS — F900 Attention-deficit hyperactivity disorder, predominantly inattentive type: Secondary | ICD-10-CM | POA: Diagnosis not present

## 2023-10-28 DIAGNOSIS — F411 Generalized anxiety disorder: Secondary | ICD-10-CM | POA: Diagnosis not present
